# Patient Record
Sex: Male | Born: 1950
Health system: Southern US, Community
[De-identification: ages and names within clinical notes are randomized; demographics above are authoritative.]

## PROBLEM LIST (undated history)

## (undated) DIAGNOSIS — R000 Tachycardia, unspecified: Secondary | ICD-10-CM

## (undated) DIAGNOSIS — D649 Anemia, unspecified: Secondary | ICD-10-CM

## (undated) DIAGNOSIS — N400 Enlarged prostate without lower urinary tract symptoms: Secondary | ICD-10-CM

## (undated) DIAGNOSIS — K219 Gastro-esophageal reflux disease without esophagitis: Secondary | ICD-10-CM

## (undated) DIAGNOSIS — I82409 Acute embolism and thrombosis of unspecified deep veins of unspecified lower extremity: Secondary | ICD-10-CM

## (undated) DIAGNOSIS — E739 Lactose intolerance, unspecified: Secondary | ICD-10-CM

## (undated) DIAGNOSIS — I10 Essential (primary) hypertension: Secondary | ICD-10-CM

## (undated) DIAGNOSIS — C61 Malignant neoplasm of prostate: Secondary | ICD-10-CM

## (undated) DIAGNOSIS — M109 Gout, unspecified: Secondary | ICD-10-CM

## (undated) DIAGNOSIS — G629 Polyneuropathy, unspecified: Secondary | ICD-10-CM

## (undated) DIAGNOSIS — G4733 Obstructive sleep apnea (adult) (pediatric): Secondary | ICD-10-CM

## (undated) DIAGNOSIS — K469 Unspecified abdominal hernia without obstruction or gangrene: Secondary | ICD-10-CM

## (undated) DIAGNOSIS — K279 Peptic ulcer, site unspecified, unspecified as acute or chronic, without hemorrhage or perforation: Secondary | ICD-10-CM

## (undated) DIAGNOSIS — H409 Unspecified glaucoma: Secondary | ICD-10-CM

## (undated) DIAGNOSIS — K911 Postgastric surgery syndromes: Secondary | ICD-10-CM

## (undated) DIAGNOSIS — K900 Celiac disease: Secondary | ICD-10-CM

## (undated) DIAGNOSIS — R001 Bradycardia, unspecified: Secondary | ICD-10-CM

## (undated) DIAGNOSIS — J329 Chronic sinusitis, unspecified: Secondary | ICD-10-CM

## (undated) DIAGNOSIS — M1711 Unilateral primary osteoarthritis, right knee: Secondary | ICD-10-CM

## (undated) DIAGNOSIS — E785 Hyperlipidemia, unspecified: Secondary | ICD-10-CM

## (undated) DIAGNOSIS — F329 Major depressive disorder, single episode, unspecified: Secondary | ICD-10-CM

## (undated) DIAGNOSIS — E7800 Pure hypercholesterolemia, unspecified: Secondary | ICD-10-CM

## (undated) HISTORY — PX: INGUINAL HERNIA REPAIR: SUR1180

## (undated) HISTORY — DX: Celiac disease: K90.0

## (undated) HISTORY — DX: Peptic ulcer, site unspecified, unspecified as acute or chronic, without hemorrhage or perforation: K27.9

## (undated) HISTORY — DX: Polyneuropathy, unspecified: G62.9

## (undated) HISTORY — DX: Anemia, unspecified: D64.9

## (undated) HISTORY — DX: Postgastric surgery syndromes: K91.1

## (undated) HISTORY — DX: Lactose intolerance, unspecified: E73.9

## (undated) HISTORY — DX: Unilateral primary osteoarthritis, right knee: M17.11

## (undated) HISTORY — DX: Unspecified abdominal hernia without obstruction or gangrene: K46.9

## (undated) HISTORY — DX: Acute embolism and thrombosis of unspecified deep veins of unspecified lower extremity: I82.409

## (undated) HISTORY — DX: Hyperlipidemia, unspecified: E78.5

## (undated) HISTORY — DX: Obstructive sleep apnea (adult) (pediatric): G47.33

## (undated) HISTORY — DX: Major depressive disorder, single episode, unspecified: F32.9

## (undated) HISTORY — DX: Benign prostatic hyperplasia without lower urinary tract symptoms: N40.0

## (undated) HISTORY — PX: SHOULDER SURGERY: SHX246

## (undated) HISTORY — DX: Pure hypercholesterolemia, unspecified: E78.00

## (undated) HISTORY — PX: MASTECTOMY: SHX3

## (undated) HISTORY — DX: Unspecified glaucoma: H40.9

## (undated) HISTORY — DX: Gastro-esophageal reflux disease without esophagitis: K21.9

## (undated) HISTORY — DX: Essential (primary) hypertension: I10

## (undated) HISTORY — DX: Chronic sinusitis, unspecified: J32.9

## (undated) HISTORY — DX: Bradycardia, unspecified: R00.1

## (undated) HISTORY — DX: Malignant neoplasm of prostate: C61

## (undated) HISTORY — DX: Tachycardia, unspecified: R00.0

## (undated) HISTORY — PX: HERNIA REPAIR: SHX51

## (undated) HISTORY — DX: Gout, unspecified: M10.9

## (undated) HISTORY — PX: OTHER SURGICAL HISTORY: SHX169

---

## 2006-02-24 HISTORY — PX: GASTRIC BYPASS: SHX52

## 2007-03-25 ENCOUNTER — Ambulatory Visit (HOSPITAL_COMMUNITY): Admission: RE | Admit: 2007-03-25 | Discharge: 2007-03-25 | Payer: Self-pay | Admitting: Surgery

## 2007-04-26 ENCOUNTER — Inpatient Hospital Stay (HOSPITAL_COMMUNITY): Admission: RE | Admit: 2007-04-26 | Discharge: 2007-04-28 | Payer: Self-pay | Admitting: Orthopedic Surgery

## 2010-02-24 HISTORY — PX: SMALL BOWEL REPAIR: SHX6447

## 2010-07-09 NOTE — Op Note (Signed)
[name], [name]                 ACCOUNT NO.:  [id number]   MEDICAL RECORD NO.:  [id number]          PATIENT TYPE:  INP   LOCATION:  5041                         FACILITY:  [facility]   PHYSICIAN:  [name], M.D.   DATE OF BIRTH:  September 01, 1950   DATE OF PROCEDURE:  04/26/2007  DATE OF DISCHARGE:                               OPERATIVE REPORT   PREOPERATIVE DIAGNOSIS:  End-stage degenerative joint disease right  shoulder.   POSTOPERATIVE DIAGNOSIS:  End-stage degenerative joint disease right  shoulder.   PROCEDURE:  Right hemiarthroplasty of shoulder.   SURGEON:  [name], M.D.   ASSISTANT:  [name], P.A.   ANESTHESIA:  General.   INDICATIONS FOR PROCEDURE:  The patient is a 60 year old male with a  long history of having had severe pain in the right shoulder with  activity, nighttime pain, really light activity pain, and limitation of  range of motion.  We talked about treatment options and also felt the  most appropriate course of action for him was going to be  hemiarthroplasty.  Preoperative x-rays showed that on the axillary view  really a midline drill would go out the back of the scapula so the  hemiarthroplasty was the appropriate course of action for him and he is  brought to the operating room for this procedure.   DESCRIPTION OF PROCEDURE:  The patient was brought to the operating room  and after adequate general anesthesia was obtained, the patient was  placed on the operating table.  The right shoulder was then prepped and  draped in the usual sterile fashion.  Following this a routine incision  was made from the clavicle down to the insertion of the deltoid,  subcutaneous tissue dissected down to the level of the deltopectoral  interval which was clearly identified and the cephalic vein was  retracted laterally with the deltoid.  Once this was completed,  attention was turned toward the deltopectoral interval which was held  with a retractor.  The  clavipectoral fascia was then divided and the  self retaining retractor was put in place.  The subscapular was then  retracted and turned into a dynamic unit and the anterior capsule was  retracted and the anterior capsulotomy was performed all the way around  from the entire shoulder capsule was released on the humeral side.  At  this point attention was turned toward the humerus where a drill bit was  entered and this was sequentially reamed to a level of 14, although it  was difficult to get a 14 down.  Once it was completed, attention was  turned toward body sizing and we got the 12 body in.  The 14 really just  would not go and we backed off to the 12 at that point.  At this point  the 12 body was placed.  I looked at the socket and the socket looked  good.  I sized the socket which sized really to a 52 even though the  head seemed more like a 56.  We did size the actual intra-articular  portion up  to 52.  A 52 +15 was chosen and put through a range of  motion.  It felt great.  Using a 52 +19 pulled too tight.  A 56 +15 was  ____________ did not give good _____________.  A 52 +15 was chosen and  the trial body was removed.  The body put in place.  The osteophytes  were taken off all the way around.  There were three trial reductions,  at this point ____________ fragmentations in the socket and none was  seen.  The granular implant was put in place with the 52 +15 head and  then put through a range of motion.  Excellent range of motion was  achieved with a #6, #2 Ethibond interrupted figure-of-eight stitches to  close the subscapularis and at this point did get about 25 degrees to 30  degrees of external rotation although rotation was full.  At this point  the subcutaneous was allowed to fall together with 2-0 Vicryl and then a  3-0  Monocryl subcuticular stitch was used.  Steri-Strips was applied.  Sterile compressive dressing was applied.  The patient was taken to the  recovery room  where she was noted to be in satisfactory condition.  Estimated blood loss for the procedure was 200 mL.      [name], M.D.  Electronically Signed     JLG/MEDQ  D:  04/26/2007  T:  04/26/2007  Job:  [id number]   cc:   [name], P.A.

## 2010-07-09 NOTE — Op Note (Signed)
[name], [name] NO.:  [id number]   MEDICAL RECORD NO.:  [id number]          PATIENT TYPE:  AMB   LOCATION:  DAY                          FACILITY:  [facility]   PHYSICIAN:  Ardeth Sportsman, MD     DATE OF BIRTH:  02/17/1951   DATE OF PROCEDURE:  03/25/2007  DATE OF DISCHARGE:                               OPERATIVE REPORT   PRIMARY CARE PHYSICIAN:  Rowe Robert, M.D., [place], [place].   CARDIOLOGIST:  Lyn Records, M.D. with [facility].   PREOPERATIVE DIAGNOSIS:  Large right inguinal hernia, possible small  left recurrent inguinal hernia.   POSTOPERATIVE DIAGNOSIS:  Large direct right inguinal hernia.  Small  indirect recurrent left inguinal hernia versus indirect left inguinal  hernia.   PROCEDURE:  Laparoscopic bilateral inguinal hernia repair.   ANESTHESIA:  General anesthesia.  Local anesthetic in a field block.  Bilateral ilioinguinal/genitofemoral/cord nerve blocks.   SPECIMENS:  None.   DRAINS:  None.   ESTIMATED BLOOD LOSS:  3 mL.   COMPLICATIONS:  No major complications.   INDICATIONS FOR PROCEDURE:  The patient is a 60 year old obese gentleman  with numerous health issues.  He has had an open inguinal hernia repair  in the past with some persistent left groin pain.  He has had some  increased right groin pain.  Also note orthopedic surgeon is Harvie Junior, M.D.  A left inguinal hernia was done open was complicated by  wound infection, but no recent skin infections.   Anatomy and physiology of abdominal formation and embryology of  testicular migration was explained, pathophysiology of inguinal  herniations with its risks of incarceration, strangulation, and other  risks were discussed.  Options were discussed and recommended for  laparoscopic preperitoneal bilateral inguinal hernia exploration with  right and possible left inguinal hernia repairs.   Risks such as stroke, heart attack, deep venous thrombosis, pulmonary  embolism, and death were discussed.  He had already just completed a six-  month course of anticoagulation for pulmonary embolism and stable off  this and was cleared by cardiology.  Risks such as bleeding hematoma,  seroma, wound infection, abscess, need for transfusion, hernia  recurrence, prolonged pain, and numerous other risks of urinary  retention, testicular loss, and other risks were discussed.  Questions  were answered and he agreed to proceed.   FINDINGS:  He has a large indirect inguinal hernia that did not seem to  be incarcerated at this time with a chronic sac.  He had a small but  definite left inguinal hernia as well in the indirect with a moderate  size cord lipoma on the left side.   DESCRIPTION OF PROCEDURE:  Informed consent was confirmed.  It was not  known if the patient voided just prior to the operating room so after  induction of general anesthesia which he tolerated well, we sterilely  I&O catheterized him one time.  He was positioned supine with both arms  tucked.  He had some compressive devices activated during the entire  case.  His abdomen was clipped, prepped and  draped in the usual sterile  fashion.   Entry was gained in the abdominal wall through an infraumbilical  curvilinear incision.  Because he is morbidly obese with BMI of 36, I  had to make the infraumbilical incision a little larger to get down to  identify the anterior rectus fascia.  A nick was made in the anterior  rectus fascia and a 12 mm Hasson port was placed in the preperitoneal  plane posterior to the left rectus abdominal muscle.  Capnoperitoneum to  15 mmHg provided good abdominal wall insufflation.  Camera dissection  was used to help free the peritoneum off the anterior abdominal wall and  enough working space was created such that a 5 mm ports were placed in  the left and right mid abdomen.   Attention was turned to the right side.  He had dense adhesions to his  midline and  despite him having only upper midline incision these were  freed sharply down, but I was able to free the peritoneum down.  I could  see a large swath of peritoneum going up into the internal ring  consistent with a large indirect inguinal hernia.  The cord structures  were freed off the hernia sac and the hernia sac was reduced.  There was  no significant cord lipoma associated with it.  The peritoneum was  peeled off as proximally as possible.  A window was made from the  anterolateral bladder and pelvis on the right side near the level of the  obturator foramen.  During dissection freeing the peritoneum off the  right rectus, there was some bleeding that was controlled with pressure  and time.   Dissection was carried out in a mirror image fashion on the left side.  The peritoneum could be seen crawling up into the inguinal canal  consistent with a recurrent indirect inguinal hernia.  There was a  moderate size cord lipoma associated with this.  The hernia was not  massive, but definitely present.  The peritoneum was peeled off of this.  In doing some dissection of the cord lipoma, there was some bleeding and  pressure was held.  I hesitated to use cautery to avoid over  skeletonization and injury to the spermatic vessels.  Vas deferens was  seen and preserved on both sides.  The peritoneum was peeled off again  as proximally as possible and lateral.   Camera inspection revealed small tears in both hernia sacs on each side.  These were closed using 3-0 Vicryl interrupted stitches using  laparoscopic intracoreal suturing.   A 15 x 15 cm ultra light weight polypropylene mesh (Ultrapro) was used  for each side.  Mesh was cut to a half-skull shape and placed in a  mirror-image fashion.  Mesh was placed in a medial and inferior flap  rested in the true pelvis covering around the level of the obturator  foramen between the bladder and the pelvic side wall.  Mesh laid well  medially,  superiorly, laterally, and proximally such that there was at  least 3 inches circumferential coverage around both internal rings.  Any  possible direct femoral and obturator defects were covered as well.   The lead points of both hernia sacs were grasped and elevated cephalad  and slightly proximal as capnopreperitoneum was evacuated.  Let it be  known that hemostasis was excellent at the end of the case.  There was  some persistent pneumoperitoneum and this was freed by making an opening  in  the peritoneum just at the infraumbilical fascial opening carefully.  That helped completely evacuate the rest of the capnoperitoneum.  The  ports were removed.  Infraumbilical fascial defect was closed using 0  Vicryl stitch to good results.  Skin was closed using 4-0 Monocryl  stitch.  Sterile dressing was applied.  The patient was extubated and  taken to the recovery room in stable condition.   I explained the operative recovery recommendations to the patient just  prior to surgery and I discussed it again with his family just right  after surgery.  They expressed understanding and appreciation.      Ardeth Sportsman, MD  Electronically Signed     SCG/MEDQ  D:  03/25/2007  T:  03/25/2007  Job:  [id number]   cc:   Harvie Junior, M.D.  Fax: [phone]

## 2010-07-12 NOTE — Discharge Summary (Signed)
NAMEABIMELEC, GROCHOWSKI                 ACCOUNT NO.:  [id number]   MEDICAL RECORD NO.:  [id number]          PATIENT TYPE:  INP   LOCATION:  5041                         FACILITY:  [facility]   PHYSICIAN:  Harvie Junior, M.D.   DATE OF BIRTH:  1951-01-21   DATE OF ADMISSION:  04/26/2007  DATE OF DISCHARGE:  04/28/2007                               DISCHARGE SUMMARY   ADMITTING DIAGNOSES:  1. End-stage degenerative joint disease, glenohumeral joint right      shoulder.  2. History of pulmonary embolus.  3. Gouty arthritis.  4. Sleep apnea.  5. Hypertension.  6. Depression.   DISCHARGE DIAGNOSES:  1. End-stage degenerative joint disease, glenohumeral joint right      shoulder.  2. History of pulmonary embolus.  3. Gouty arthritis.  4. Sleep apnea.  5. Hypertension.  6. Depression.   PROCEDURES IN HOSPITAL:  Will be hemiarthroplasty, right shoulder, Jodi Geralds MD, April 26, 2007.   BRIEF HISTORY:  Mr. Mccallum is a pleasant 60 year old male who has a long  history of right shoulder pain.  He has had progressive pain and  stiffness in his shoulder and difficulties with activity of daily living  because of the limited motion of the pain.  He developed night pain and  pain with the use of his arm and quite a bit of popping and grinding in  the shoulder.  X-rays of the right shoulder showed that he had end-stage  glenohumeral joint degenerative joint disease, which was severe with  marked spurring.  Based on his radiographic and clinical findings, he is  felt to be a candidate for a right shoulder hemiarthroplasty.   PERTINENT LABORATORY STUDIES:  EKG on admission showed sinus  bradycardia, otherwise, normal EKG.  Hemoglobin on admission was 12.5,  hematocrit 37.5, and this is within normal limits.  On postop day #1,  his hemoglobin was 11.5 with a hematocrit of 34.2.  CMET on admission  showed no abnormalities.  Urinalysis was normal.   HOSPITAL COURSE:  Mr. Fuster underwent right shoulder  hemiarthroplasty.  Preoperatively, he was given Ancef and gentamicin IV.  He was put on PCA  morphine pump for pain control and occupational therapy saw the patient  for passive range of motion of the right shoulder.  On postop day #1,  he had a rough night, he was taking fluids and voiding without  difficulty.  His vital signs were stable.  He was afebrile.  His right  shoulder dressing was clean and dry.  His hemoglobin was 11.5.  He was  relatively continued on IV analgesics.  He was continued on PCA morphine  pump.  On postop day #2, he was doing relatively well.  He was ready to  be discharged.  He was taking fluids and voiding without difficulty,  ambulating in the hallway, and no chest pain or shortness of breath.  His vital signs were stable and he was afebrile.  His right shoulder was  wound benign.  His IV and PCA morphine pump was discontinued.  The  dressing was changed.  He was discharged  home in improved condition.  He  was given prescription for Percocet 5 mg p.r.n. for pain and Robaxin 750  mg p.r.n. spasm.  He was asked to take aspirin 325  mg b.i.d. with food as he had a previous history of pulmonary embolus.  He will need home health, occupational therapy 3 times per week for  passive range of motion of the right shoulder, avoiding external  rotation, follow up with Dr. Luiz Blare office in 10 days.      Marshia Ly, P.A.      Harvie Junior, M.D.  Electronically Signed    JB/MEDQ  D:  06/30/2007  T:  07/01/2007  Job:  [id number]

## 2010-11-14 LAB — BASIC METABOLIC PANEL
BUN: 8
GFR calc non Af Amer: >60
Potassium: 4.2
Sodium: 140

## 2010-11-14 LAB — PROTIME-INR: Prothrombin Time: 13.2

## 2010-11-15 LAB — CBC
MCV: 86.6
RBC: 4.32
WBC: 6.1

## 2010-11-15 LAB — COMPREHENSIVE METABOLIC PANEL
ALT: 17
Alkaline Phosphatase: 66
CO2: 29
Calcium: 9.2
Chloride: 102
GFR calc non Af Amer: >60
Glucose, Bld: 96
Potassium: 4.4
Sodium: 139
Total Bilirubin: 0.6

## 2010-11-15 LAB — URINALYSIS, ROUTINE W REFLEX MICROSCOPIC
Bilirubin Urine: NEGATIVE
Glucose, UA: NEGATIVE
Ketones, ur: NEGATIVE
pH: 6.5

## 2010-11-15 LAB — TYPE AND SCREEN: Antibody Screen: NEGATIVE

## 2010-11-18 LAB — CBC
HCT: 34.2 — ABNORMAL LOW
Hemoglobin: 11.5 — ABNORMAL LOW
MCHC: 33.6
RDW: 15.2

## 2014-05-08 DIAGNOSIS — E785 Hyperlipidemia, unspecified: Secondary | ICD-10-CM | POA: Diagnosis not present

## 2014-05-08 DIAGNOSIS — D649 Anemia, unspecified: Secondary | ICD-10-CM | POA: Diagnosis not present

## 2014-05-08 DIAGNOSIS — T7840XA Allergy, unspecified, initial encounter: Secondary | ICD-10-CM | POA: Diagnosis not present

## 2014-05-08 DIAGNOSIS — R42 Dizziness and giddiness: Secondary | ICD-10-CM | POA: Diagnosis not present

## 2014-05-08 DIAGNOSIS — N400 Enlarged prostate without lower urinary tract symptoms: Secondary | ICD-10-CM | POA: Diagnosis not present

## 2014-05-08 DIAGNOSIS — I10 Essential (primary) hypertension: Secondary | ICD-10-CM | POA: Diagnosis not present

## 2014-05-08 DIAGNOSIS — E739 Lactose intolerance, unspecified: Secondary | ICD-10-CM | POA: Diagnosis not present

## 2014-05-08 DIAGNOSIS — N429 Disorder of prostate, unspecified: Secondary | ICD-10-CM | POA: Diagnosis not present

## 2014-05-08 DIAGNOSIS — Z8042 Family history of malignant neoplasm of prostate: Secondary | ICD-10-CM | POA: Diagnosis not present

## 2014-05-15 DIAGNOSIS — E875 Hyperkalemia: Secondary | ICD-10-CM | POA: Diagnosis not present

## 2014-05-15 DIAGNOSIS — M109 Gout, unspecified: Secondary | ICD-10-CM | POA: Diagnosis not present

## 2014-05-16 DIAGNOSIS — R000 Tachycardia, unspecified: Secondary | ICD-10-CM | POA: Diagnosis not present

## 2014-05-16 DIAGNOSIS — I10 Essential (primary) hypertension: Secondary | ICD-10-CM | POA: Diagnosis not present

## 2014-05-16 DIAGNOSIS — E875 Hyperkalemia: Secondary | ICD-10-CM | POA: Diagnosis not present

## 2014-05-29 ENCOUNTER — Encounter: Payer: Self-pay | Admitting: Interventional Cardiology

## 2014-06-07 DIAGNOSIS — I951 Orthostatic hypotension: Secondary | ICD-10-CM | POA: Diagnosis not present

## 2014-06-07 DIAGNOSIS — E785 Hyperlipidemia, unspecified: Secondary | ICD-10-CM | POA: Diagnosis not present

## 2014-06-07 DIAGNOSIS — R000 Tachycardia, unspecified: Secondary | ICD-10-CM | POA: Diagnosis not present

## 2014-06-07 DIAGNOSIS — R269 Unspecified abnormalities of gait and mobility: Secondary | ICD-10-CM | POA: Diagnosis not present

## 2014-06-14 ENCOUNTER — Institutional Professional Consult (permissible substitution): Payer: Self-pay | Admitting: Interventional Cardiology

## 2014-06-14 ENCOUNTER — Encounter: Payer: Self-pay | Admitting: Neurology

## 2014-06-14 ENCOUNTER — Ambulatory Visit (INDEPENDENT_AMBULATORY_CARE_PROVIDER_SITE_OTHER): Payer: Medicare Other | Admitting: Neurology

## 2014-06-14 VITALS — BP 122/78 | HR 72 | Ht 70.5 in | Wt 257.0 lb

## 2014-06-14 DIAGNOSIS — R739 Hyperglycemia, unspecified: Secondary | ICD-10-CM

## 2014-06-14 DIAGNOSIS — G251 Drug-induced tremor: Secondary | ICD-10-CM | POA: Diagnosis not present

## 2014-06-14 DIAGNOSIS — G609 Hereditary and idiopathic neuropathy, unspecified: Secondary | ICD-10-CM | POA: Diagnosis not present

## 2014-06-14 DIAGNOSIS — R7309 Other abnormal glucose: Secondary | ICD-10-CM | POA: Diagnosis not present

## 2014-06-14 DIAGNOSIS — R251 Tremor, unspecified: Secondary | ICD-10-CM

## 2014-06-14 LAB — RPR

## 2014-06-14 LAB — FOLATE: Folate: 14.3 ng/mL

## 2014-06-14 LAB — HEMOGLOBIN A1C
Hgb A1c MFr Bld: 5.9 % — ABNORMAL HIGH (ref <5.7)
MEAN PLASMA GLUCOSE: 123 mg/dL — AB (ref <117)

## 2014-06-14 LAB — TSH: TSH: 0.89 u[IU]/mL (ref 0.350–4.500)

## 2014-06-14 LAB — VITAMIN B12: VITAMIN B 12: 609 pg/mL (ref 211–911)

## 2014-06-14 MED ORDER — GABAPENTIN 300 MG PO CAPS: ORAL_CAPSULE | ORAL | Status: DC

## 2014-06-14 NOTE — Progress Notes (Signed)
[name] was seen today in the movement disorders clinic for neurologic consultation at the request of DEWEY,ELIZABETH, MD.  The consultation is for the evaluation of tremor and gait changes.  The records that were made available to me were reviewed.  The patients wife supplements the history.  Pt thinks that tremor started before the balance issues.  It started perhaps a few years ago but it has been more noticeable since he moved back here over the last few months (prior to that he lived apart from his wife for 5 years and when he moved back his wife commented on it).  Over the last 2 weeks he was taken off of metoprolol and he is not sure if that affected tremor.  He is on a supplement called lithium orotate and has been on that for 6 months; he states that a Education officer, museum told him that it could ward off brain "fog."  He doesn't know if it changed tremor/balance.  He noted balance issues over the last few months, perhaps even longer.  He had a few falls but some were on ice when living in other areas.  He often feels that he is listing to the right.  He has paresthesias in the feet.  They feel cold "in my head but physically if I touch them they don't."  They also will burn in the middle of the night.  When he first gets out of bed and traps on the floor the plantar aspect will hurt significantly.  It seems to be better somewhat throughout the day although the feet do not necessarily ever feel normal.  He has an appointment with cardiology in the future as he thinks that the circulation is not good in the feet.    Tremor: Yes.     Affected by caffeine: doesn't know (drinks mellow yellow)  Affected by alcohol: doesn't drink alcohol  Affected by stress:  unknown  Affected by fatigue:  Unknown per pt; no per wife  Spills soup if on spoon:  No.  Spills glass of liquid if full:  No.  Affects ADL's (tying shoes, brushing teeth, etc):  No.  Other Specific Symptoms:  Voice: wife thinks mumbles  more and can't hear him as well Sleep: trouble staying asleep; wears CPAP and hasn't had new study since 1990's and mask is leaking - PCP is thinking about new study; if mask fitting right he sleeps through the night  Vivid Dreams:  No.  Acting out dreams:  No.  (no longer; used to sleep walk) Wet Pillows: No. Postural symptoms:  Yes.    Falls?  Yes.   but very rarely (fell down stairs in Jan but attributed to shoes/slippery stairs; also fell down an embankment) Bradykinesia symptoms: difficulty getting out of a chair Loss of smell:  No. Loss of taste:  No. Urinary Incontinence:  No. Difficulty Swallowing:  No. (sometimes will have cough after swallow pills) Handwriting, micrographia: No. Trouble with ADL's:  No.  Trouble buttoning clothing: No. Depression:  No. Memory changes:  No. Hallucinations:  No.  visual distortions: No. N/V:  No. Lightheaded:  No.  Syncope: No. Diplopia:  No. Dyskinesia:  No.  ALLERGIES:  No Known Allergies  CURRENT MEDICATIONS:  Outpatient Encounter Prescriptions as of 06/14/2014  Medication Sig  . allopurinol (ZYLOPRIM) 100 MG tablet Take 100 mg by mouth daily.  Marland Kitchen buPROPion (WELLBUTRIN XL) 150 MG 24 hr tablet Take 150 mg by mouth daily.  . citalopram (CELEXA) 40 MG  tablet Take 20 mg by mouth daily.  . finasteride (PROSCAR) 5 MG tablet Take 5 mg by mouth daily.  Marland Kitchen LITHIUM PO Take 5 mg by mouth daily. Lithium Orotate over the counter supplement  . pantoprazole (PROTONIX) 40 MG tablet Take 40 mg by mouth daily.  . simvastatin (ZOCOR) 40 MG tablet Take 40 mg by mouth daily.  . Travoprost, BAK Free, (TRAVATAN) 0.004 % SOLN ophthalmic solution 1 drop at bedtime.  . gabapentin (NEURONTIN) 300 MG capsule 1 tablet in the morning, 2 tablets at night    PAST MEDICAL HISTORY:   Past Medical History  Diagnosis Date  . Depression   . Gout   . Hypertension   . Hypercholesteremia   . BPH (benign prostatic hyperplasia)   . Tachycardia   . Glaucoma      PAST SURGICAL HISTORY:   Past Surgical History  Procedure Laterality Date  . Hernia repair      x 5  . Shoulder surgery Right   . Mastectomy Right   . Small bowel repair      for ulcer     SOCIAL HISTORY:   History   Social History  . Marital Status: Married    Spouse Name: N/A  . Number of Children: N/A  . Years of Education: N/A   Occupational History  . retired    Social History Main Topics  . Smoking status: Former Smoker    Quit date: 02/25/1980  . Smokeless tobacco: Not on file  . Alcohol Use: 0.0 oz/week    0 Standard drinks or equivalent per week     Comment: 1-2 times a year  . Drug Use: No  . Sexual Activity: Not on file   Other Topics Concern  . Not on file   Social History Narrative  . No narrative on file    FAMILY HISTORY:   Family Status  Relation Status Death Age  . Mother Deceased 48    dementia, alzheimer's, PD  . Father Deceased 51    prostate cancer, heart problems  . Sister Alive     healthy  . Brother Alive     healthy  . Daughter Alive     healthy    ROS:  A complete 10 system review of systems was obtained and was unremarkable apart from what is mentioned above.  PHYSICAL EXAMINATION:    VITALS:   Filed Vitals:   06/14/14 0749  BP: 122/78  Pulse: 72  Height: 5' 10.5" (1.791 m)  Weight: 257 lb (116.574 kg)    GEN:  The patient appears stated age and is in NAD. HEENT:  Normocephalic, atraumatic.  The mucous membranes are moist. The superficial temporal arteries are without ropiness or tenderness. CV:  RRR Lungs:  CTAB Neck/HEME:  There are no carotid bruits bilaterally.  Neurological examination:  Orientation: The patient is alert and oriented x3. Fund of knowledge is appropriate.  Recent and remote memory are intact.  Attention and concentration are normal.    Able to name objects and repeat phrases. Cranial nerves: There is good facial symmetry. Pupils are equal round and reactive to light bilaterally.  Fundoscopic exam reveals clear margins bilaterally. Extraocular muscles are intact. The visual fields are full to confrontational testing. The speech is fluent and clear. Soft palate rises symmetrically and there is no tongue deviation. Hearing is intact to conversational tone. Sensation: Sensation is intact to light and pinprick throughout (facial, trunk, extremities).  Pinprick is decreased in a stocking distribution.  Vibration is decreased in a distal fashion. There is no extinction with double simultaneous stimulation. There is no sensory dermatomal level identified. Motor: Strength is 5/5 in the bilateral upper and lower extremities.   Shoulder shrug is equal and symmetric.  There is no pronator drift. Deep tendon reflexes: Deep tendon reflexes are 1/4 at the bilateral biceps, triceps, brachioradialis, patella and absent at the bilateral achilles. Plantar responses are downgoing bilaterally.  Movement examination: Tone: There is normal tone in the bilateral upper extremities.  The tone in the lower extremities is normal.  Abnormal movements: Very minor evidence of tremor of the outstretched hands.  No evidence of asterixis.  The patient is able to draw archimedes spirals without significant difficulty.  The patient is able to pour water from one glass to another without spilling it.  Coordination:  There is no decremation with RAM's, with any form of rapid alternating movements, including alternating supination and pronation of the forearm, hand opening and closing, finger taps, heel taps and toe taps. Gait and Station: The patient has no difficulty arising out of a deep-seated chair without the use of the hands. The patient's stride length is normal, with normal arm swing and a slightly wide-based gait.  He has significant difficulty ambulating in a tandem fashion.  He is able to heel toe walk.  He is able to stand in the Romberg position with eyes open, but does sway with eyes closed.    Labs: I  reviewed labs from his primary care physician.  Patient states that these were fasting labs on 05/15/2014.  His fasting glucose was just slightly elevated at 101.  His AST was 15, ALT 14.  His potassium was just slightly elevated at 5.2 and the patient states that his medications were therefore adjusted.  ASSESSMENT/PLAN:  1.  Tremor.  -I saw very little evidence of tremor today.  I saw no evidence of any type of neurodegenerative tremor.  I saw no evidence of Parkinson's disease.  He is on a supplement called lithium orotate, and I explained to him that while lithium orotate is capable of providing lithium to the body, there are no systematic reviews supporting the efficacy and it is not approved by the FDA as it is a supplement.  However, I would suspect that like other forms of lithium, it could contribute to tremor.  I asked him to stop this and see how tremor responds. 2.  Gait instability  -The patient has strong evidence of peripheral neuropathy on his examination today.  I suspect that this is the root and etiology of his gait instability.  We had a long discussion about peripheral neuropathy.  Greater than 50% of the 60 minute visit was spent in counseling.  We discussed safety and pathophysiology.  We discussed etiologies as well.  -The patient wants to hold on EMG.  -We'll do lab work to look at reversible causes including B12, folate, RPR, SPEP/UPEP with immunofixation, hemoglobin A1c.  We will add a TSH given tremor.  -The patient would like to try gabapentin given the burning paresthesias.  He was given a titration schedule to ultimately work up to 300 mg in the morning and 600 mg at night.  He will let me know if he has any side effects.  Risks, benefits, side effects and alternative therapies were discussed.  The opportunity to ask questions was given and they were answered to the best of my ability.  The patient expressed understanding and willingness to follow the outlined  treatment  protocols. 3.  Obstructive sleep apnea syndrome.  -Patient and I talked about morbidity and mortality associated with untreated sleep apnea.  He is faithful with his CPAP but is having significant issues with leak.  It sounds like he may need either a new titration study or at least new gear/mask.  He is working with his primary care physician on this. 4.  I will see him back in the next few months, sooner should new neurologic issues arise.

## 2014-06-14 NOTE — Progress Notes (Signed)
Note routed to Dr Dewey.  

## 2014-06-14 NOTE — Patient Instructions (Signed)
1. Stop Lithium supplement.  2. Start Neurontin 300 mg. Take one tablet at night for one week. Then one tablet twice daily for one week. If tolerating medication you can increase to one tablet in the morning, two tablets at night.  3. Your provider has requested that you have labwork completed today. Please go to [facility] on the first floor of this building before leaving the office today.

## 2014-06-16 LAB — UIFE/LIGHT CHAINS/TP QN, 24-HR UR
Albumin, U: DETECTED
Alpha 1, Urine: DETECTED — AB
Alpha 2, Urine: DETECTED — AB
Beta, Urine: DETECTED — AB
GAMMA UR: DETECTED — AB
TOTAL PROTEIN, URINE-UPE24: 26 mg/dL — AB (ref 5–25)

## 2014-06-16 LAB — SPEP & IFE WITH QIG
ALBUMIN ELP: 3.7 g/dL — AB (ref 3.8–4.8)
ALPHA-1-GLOBULIN: 0.3 g/dL (ref 0.2–0.3)
Alpha-2-Globulin: 0.6 g/dL (ref 0.5–0.9)
Beta 2: 0.4 g/dL (ref 0.2–0.5)
Beta Globulin: 0.4 g/dL (ref 0.4–0.6)
Gamma Globulin: 0.8 g/dL (ref 0.8–1.7)
IGA: 208 mg/dL (ref 68–379)
IGM, SERUM: 63 mg/dL (ref 41–251)
IgG (Immunoglobin G), Serum: 816 mg/dL (ref 650–1600)
Total Protein, Serum Electrophoresis: 6.2 g/dL (ref 6.1–8.1)

## 2014-06-19 ENCOUNTER — Telehealth: Payer: Self-pay | Admitting: Neurology

## 2014-06-19 NOTE — Telephone Encounter (Signed)
Patient made aware labs okay. Results forwarded to Dr Ernie Hew.

## 2014-06-19 NOTE — Telephone Encounter (Signed)
-----   Message from [place], DO sent at 06/18/2014  9:17 PM EDT ----- You can let pt know that his labs looked ok.  Send copy to PCP

## 2014-06-23 DIAGNOSIS — N400 Enlarged prostate without lower urinary tract symptoms: Secondary | ICD-10-CM | POA: Diagnosis not present

## 2014-06-23 DIAGNOSIS — G64 Other disorders of peripheral nervous system: Secondary | ICD-10-CM | POA: Diagnosis not present

## 2014-06-23 DIAGNOSIS — I951 Orthostatic hypotension: Secondary | ICD-10-CM | POA: Diagnosis not present

## 2014-06-23 DIAGNOSIS — I10 Essential (primary) hypertension: Secondary | ICD-10-CM | POA: Diagnosis not present

## 2014-06-26 ENCOUNTER — Encounter: Payer: Self-pay | Admitting: Cardiology

## 2014-06-26 ENCOUNTER — Ambulatory Visit (INDEPENDENT_AMBULATORY_CARE_PROVIDER_SITE_OTHER): Payer: Medicare Other | Admitting: Cardiology

## 2014-06-26 VITALS — BP 130/70 | HR 54 | Ht 70.5 in | Wt 257.2 lb

## 2014-06-26 DIAGNOSIS — R5383 Other fatigue: Secondary | ICD-10-CM | POA: Insufficient documentation

## 2014-06-26 DIAGNOSIS — I10 Essential (primary) hypertension: Secondary | ICD-10-CM | POA: Diagnosis not present

## 2014-06-26 DIAGNOSIS — R0602 Shortness of breath: Secondary | ICD-10-CM | POA: Diagnosis not present

## 2014-06-26 DIAGNOSIS — T733XXA Exhaustion due to excessive exertion, initial encounter: Secondary | ICD-10-CM | POA: Diagnosis not present

## 2014-06-26 DIAGNOSIS — R002 Palpitations: Secondary | ICD-10-CM

## 2014-06-26 MED ORDER — METOPROLOL SUCCINATE ER 25 MG PO TB24: 12.5000 mg | ORAL_TABLET | Freq: Every day | ORAL | Status: DC

## 2014-06-26 NOTE — Progress Notes (Signed)
Cardiology Office Note   Date:  06/26/2014   ID:  [name], DOB Nov 25, 1950, MRN [id number]  PCP:  Rachell Cipro, MD    Chief Complaint  Patient presents with  . Palpitations  . Shortness of Breath  . Fatigue      History of Present Illness: [name] is a 64 y.o. male who presents for evaluation of LE pain.  He has a history of dyslipidemia, HTN, tachycardia and depression.  He was referred to neurology with complaints of coldness in his feet but when his wife would check them they were warm.  Then sometimes in the am with complaints of feeling like his feet were on fire.  The neurologist feels that he has a peripheral neuropathy and was placed on Neurontin.  He has some leg cramps in the bed but no pain with walking.  He also has a long history of tachycardia dating back to a surgery when his nurse noted post op that he was tachycardic and was evaluated by Cardiology in [place] but declined followup.  He did not have a workup done.  This was about 10 years.  He notices his pulse will be as high as 160bpm when taking his BP. He sometimes will feel his heart racing but most time he feels SOB with low energy and wiped out. He says that Dr. Ernie Hew has stopped one of his BP meds but he stopped the wrong one and instead stopped the metoprolol instead of the other med and then started having palpitations.  He finally realized it when he went back to see his PCP and started back on BB and palpitations resolved.  When he is taking the metoprolol it is very rare for him to take the palpitations.  He does not drink caffeinated products except for occasional mellow yellow.  He wore a heart monitor about 10 year ago but thinks it was normal.  He has been on BB since then.  He denies any chest pain or pressure but does have DOE when mowing the yard.  Last week he had to go in 3 times and rest but that was when he was off the BB and had the tachycardia.  After being back on the BB his SOB has resolved  but he does notice that he gets fatigued with exertion which is new for him.  He denies any LE edema, dizziness or syncope.     Past Medical History  Diagnosis Date  . Depression   . Gout   . Hypertension   . Hypercholesteremia   . BPH (benign prostatic hyperplasia)   . Tachycardia   . Glaucoma     Past Surgical History  Procedure Laterality Date  . Hernia repair      x 5  . Shoulder surgery Right   . Mastectomy Right   . Small bowel repair      for ulcer      Current Outpatient Prescriptions  Medication Sig Dispense Refill  . allopurinol (ZYLOPRIM) 100 MG tablet Take 100 mg by mouth 2 (two) times daily.     . citalopram (CELEXA) 40 MG tablet Take 20 mg by mouth daily.    . finasteride (PROSCAR) 5 MG tablet Take 5 mg by mouth daily.    Marland Kitchen gabapentin (NEURONTIN) 300 MG capsule 1 tablet in the morning, 2 tablets at night 90 capsule 3  . metoprolol succinate (TOPROL-XL) 25 MG 24 hr tablet Take 25 mg by mouth daily.    Marland Kitchen  pantoprazole (PROTONIX) 40 MG tablet Take 40 mg by mouth daily.    . simvastatin (ZOCOR) 40 MG tablet Take 40 mg by mouth daily.    . Travoprost, BAK Free, (TRAVATAN) 0.004 % SOLN ophthalmic solution 1 drop at bedtime.     No current facility-administered medications for this visit.    Allergies:   Review of patient's allergies indicates no known allergies.    Social History:  The patient  reports that he quit smoking about 34 years ago. He does not have any smokeless tobacco history on file. He reports that he drinks alcohol. He reports that he does not use illicit drugs.   Family History:  The patient's family history includes Alzheimer's disease in his mother; Arrhythmia in his father; Cancer in his father; Heart disease in his father.    ROS:  Please see the history of present illness.   Otherwise, review of systems are positive for none.   All other systems are reviewed and negative.    PHYSICAL EXAM: VS:  BP 130/70 mmHg  Pulse 54  Ht 5' 10.5"  (1.791 m)  Wt 257 lb 3.2 oz (116.665 kg)  BMI 36.37 kg/m2 , BMI Body mass index is 36.37 kg/(m^2). GEN: Well nourished, well developed, in no acute distress HEENT: normal Neck: no JVD, carotid bruits, or masses Cardiac: RRR; no murmurs, rubs, or gallops,no edema  Respiratory:  clear to auscultation bilaterally, normal work of breathing GI: soft, nontender, nondistended, + BS MS: no deformity or atrophy Skin: warm and dry, no rash Neuro:  Strength and sensation are intact Psych: euthymic mood, full affect   EKG:  EKG is ordered today. The ekg ordered today demonstrates sinus bradycardia at 54bpm with low voltage QRS in limb leads and  Septal infarct   Recent Labs: 06/14/2014: TSH 0.890    Lipid Panel No results found for: CHOL, TRIG, HDL, CHOLHDL, VLDL, LDLCALC, LDLDIRECT    Wt Readings from Last 3 Encounters:  06/26/14 257 lb 3.2 oz (116.665 kg)  06/14/14 257 lb (116.574 kg)       ASSESSMENT AND PLAN:  1.  Leg pain - symptoms c/w peripheral neuropathy.  He has no LE claudication symptoms.  He is followed by neuro and was placed on Neurotin 2.  Dyslipidemia - per PCP 3.  HTN - controlled on BB 4.  Tachcyardia - controlled on BB.  I will get a 2D echo to make sure his LVF is normal.  His HR is controlled on BB. 5.  DOE most likely secondary to BB withdrawal that has resolved after restarting BB.  6.  Fatigue - he is bradycardic today which may be the etiology but I would like to get a Stress myoview to rule out ischemia.  I have asked him to decrease his metoprolol to 25mg  1/2 tablet daily and I have asked him to call in a week and let me know if the fatigue improves.  If he has more palpitations on the lower dose of BB then will get a heart monitor.    Current medicines are reviewed at length with the patient today.  The patient does not have concerns regarding medicines.  The following changes have been made:  no change  Labs/ tests ordered today include: see above  assessment and plan No orders of the defined types were placed in this encounter.     Disposition:   FU with me PRN pending results of studies   Signed, Sueanne Margarita, MD  06/26/2014 4:20 PM  [facility] [address], [place], [place]  [address] Phone: [phone]; Fax: [phone]

## 2014-06-26 NOTE — Patient Instructions (Addendum)
Medication Instructions:  Your physician has recommended you make the following change in your medication: 1) DECREASE TOPROL to 25 mg 1/2 tablet (12.5 mg total) daily  Labwork: None  Testing/Procedures: Your physician has requested that you have an echocardiogram. Echocardiography is a painless test that uses sound waves to create images of your heart. It provides your doctor with information about the size and shape of your heart and how well your heart's chambers and valves are working. This procedure takes approximately one hour. There are no restrictions for this procedure.  Your physician has requested that you have a NUCLEAR STRESS TEST.  Follow-Up: You have an appointment with Dr. Radford Pax on August 04, 2014 at 1:45 PM.   Any Other Special Instructions Will Be Listed Below (If Applicable).

## 2014-07-05 ENCOUNTER — Telehealth: Payer: Self-pay | Admitting: Cardiology

## 2014-07-05 NOTE — Telephone Encounter (Signed)
Calling to follow up with Dr. Radford Pax. States he was told to call in reference to the decrease of Metoprolol to 12.5 mg. States  that he has not had any difficulty with cutting his Metoprolol to 12.5 mg.  States has not checked his HR since office visit on 5/2 but has not felt HR being fast.  Feels good. Advised will forward to Dr.Turner.

## 2014-07-05 NOTE — Telephone Encounter (Signed)
New message     Calling to give update on medication  Pt c/o medication issue:  1. Name of Medication: metoprolol  2. How are you currently taking this medication (dosage and times per day)? 25  1/2 tablet 3. Are you having a reaction (difficulty breathing--STAT)? no 4. What is your medication issue?  Seems to be working no tachycardia by cutting medication in half

## 2014-07-06 ENCOUNTER — Telehealth (HOSPITAL_COMMUNITY): Payer: Self-pay | Admitting: Radiology

## 2014-07-06 ENCOUNTER — Telehealth (HOSPITAL_COMMUNITY): Payer: Self-pay | Admitting: *Deleted

## 2014-07-06 NOTE — Telephone Encounter (Signed)
Patient given detailed instructions per Myocardial Perfusion Study Information Sheet for test on 07/10/14 at 8:30 Patient verbalized understanding. Crissie Figures, RN

## 2014-07-06 NOTE — Telephone Encounter (Signed)
.  Left message on voicemail in reference to upcoming appointment scheduled for 07/10/14. Phone number given for a call back so details instructions can be given. Crissie Figures, RN

## 2014-07-10 ENCOUNTER — Ambulatory Visit (HOSPITAL_BASED_OUTPATIENT_CLINIC_OR_DEPARTMENT_OTHER): Payer: Medicare Other

## 2014-07-10 ENCOUNTER — Other Ambulatory Visit: Payer: Self-pay

## 2014-07-10 ENCOUNTER — Ambulatory Visit (HOSPITAL_COMMUNITY): Payer: Medicare Other | Attending: Cardiovascular Disease

## 2014-07-10 DIAGNOSIS — R0600 Dyspnea, unspecified: Secondary | ICD-10-CM | POA: Insufficient documentation

## 2014-07-10 DIAGNOSIS — I10 Essential (primary) hypertension: Secondary | ICD-10-CM | POA: Insufficient documentation

## 2014-07-10 DIAGNOSIS — R0602 Shortness of breath: Secondary | ICD-10-CM

## 2014-07-10 DIAGNOSIS — E785 Hyperlipidemia, unspecified: Secondary | ICD-10-CM | POA: Insufficient documentation

## 2014-07-10 DIAGNOSIS — T733XXA Exhaustion due to excessive exertion, initial encounter: Secondary | ICD-10-CM

## 2014-07-10 LAB — MYOCARDIAL PERFUSION IMAGING
CHL CUP MPHR: 155 {beats}/min
CHL CUP NUCLEAR SRS: 4
CHL CUP NUCLEAR SSS: 5
CHL CUP STRESS STAGE 2 GRADE: 0 %
CHL CUP STRESS STAGE 2 SPEED: 0 mph
CHL CUP STRESS STAGE 3 HR: 53 {beats}/min
CHL CUP STRESS STAGE 3 SPEED: 0 mph
CHL CUP STRESS STAGE 4 GRADE: 10 %
CHL CUP STRESS STAGE 5 GRADE: 12 %
CHL CUP STRESS STAGE 5 HR: 108 {beats}/min
CHL CUP STRESS STAGE 6 GRADE: 14 %
CHL CUP STRESS STAGE 6 HR: 133 {beats}/min
CHL CUP STRESS STAGE 6 SPEED: 3.4 mph
CHL CUP STRESS STAGE 7 HR: 133 {beats}/min
CHL CUP STRESS STAGE 7 SPEED: 3.4 mph
CHL CUP STRESS STAGE 8 DBP: 85 mmHg
CHL CUP STRESS STAGE 8 SBP: 128 mmHg
CHL CUP STRESS STAGE 9 GRADE: 0 %
CHL CUP STRESS STAGE 9 SPEED: 0 mph
CSEPEDS: 0 s
Estimated workload: 10.1 METS
Exercise duration (min): 9 min
LV dias vol: 123 mL
LVSYSVOL: 45 mL
NUC STRESS EF: 63 %
Peak HR: 133 {beats}/min
Percent HR: 86 %
Percent of predicted max HR: 85 %
RATE: 0.4
Rest HR: 48 {beats}/min
SDS: 1
Stage 1 DBP: 94 mmHg
Stage 1 Grade: 0 %
Stage 1 HR: 52 {beats}/min
Stage 1 SBP: 146 mmHg
Stage 1 Speed: 0 mph
Stage 2 DBP: 93 mmHg
Stage 2 HR: 53 {beats}/min
Stage 2 SBP: 150 mmHg
Stage 3 Grade: 0.1 %
Stage 4 HR: 97 {beats}/min
Stage 4 Speed: 1.7 mph
Stage 5 DBP: 79 mmHg
Stage 5 SBP: 182 mmHg
Stage 5 Speed: 2.5 mph
Stage 6 DBP: 84 mmHg
Stage 6 SBP: 138 mmHg
Stage 7 Grade: 14 %
Stage 8 Grade: 0 %
Stage 8 HR: 110 {beats}/min
Stage 8 Speed: 0 mph
Stage 9 DBP: 82 mmHg
Stage 9 HR: 66 {beats}/min
Stage 9 SBP: 184 mmHg
TID: 0.87

## 2014-07-10 MED ORDER — TECHNETIUM TC 99M SESTAMIBI GENERIC - CARDIOLITE
11.0000 | Freq: Once | INTRAVENOUS | Status: AC | PRN
  Administered 2014-07-10: 11 via INTRAVENOUS

## 2014-07-10 MED ORDER — TECHNETIUM TC 99M SESTAMIBI GENERIC - CARDIOLITE
33.0000 | Freq: Once | INTRAVENOUS | Status: AC | PRN
  Administered 2014-07-10: 33 via INTRAVENOUS

## 2014-07-14 ENCOUNTER — Other Ambulatory Visit (HOSPITAL_COMMUNITY): Payer: Medicare Other

## 2014-07-18 DIAGNOSIS — H40033 Anatomical narrow angle, bilateral: Secondary | ICD-10-CM | POA: Diagnosis not present

## 2014-07-18 DIAGNOSIS — H4011X2 Primary open-angle glaucoma, moderate stage: Secondary | ICD-10-CM | POA: Diagnosis not present

## 2014-07-19 ENCOUNTER — Telehealth: Payer: Self-pay | Admitting: *Deleted

## 2014-07-19 NOTE — Telephone Encounter (Signed)
D/c it for a week and then, if doing better, we can try something else if he would like (could try lyrica samples)

## 2014-07-19 NOTE — Telephone Encounter (Signed)
Patient made aware. He will stop Gabapentin and we will check on him in a week.

## 2014-07-19 NOTE — Telephone Encounter (Signed)
Spoke with patient. He states he has been on 3 gabapentin a day for about 2 weeks and symptoms are worse. Prior to starting medication he was having some "hotness" in feet that would wake him up in the morning, along with numbness, and gait instability. He states he is still having all these symptoms but the feeling of heat in his feet is all day long and he has also developed stabbing/shooting pains in feet. He feels much worse. Please advise.

## 2014-07-19 NOTE — Telephone Encounter (Signed)
Patient call in reference to his tolerance to Gabapentin    Call back number [phone]

## 2014-07-26 ENCOUNTER — Telehealth: Payer: Self-pay | Admitting: Neurology

## 2014-07-26 DIAGNOSIS — I10 Essential (primary) hypertension: Secondary | ICD-10-CM | POA: Diagnosis not present

## 2014-07-26 DIAGNOSIS — N400 Enlarged prostate without lower urinary tract symptoms: Secondary | ICD-10-CM | POA: Diagnosis not present

## 2014-07-26 DIAGNOSIS — F329 Major depressive disorder, single episode, unspecified: Secondary | ICD-10-CM | POA: Diagnosis not present

## 2014-07-26 DIAGNOSIS — H40033 Anatomical narrow angle, bilateral: Secondary | ICD-10-CM | POA: Diagnosis not present

## 2014-07-26 DIAGNOSIS — G64 Other disorders of peripheral nervous system: Secondary | ICD-10-CM | POA: Diagnosis not present

## 2014-07-26 DIAGNOSIS — H4011X2 Primary open-angle glaucoma, moderate stage: Secondary | ICD-10-CM | POA: Diagnosis not present

## 2014-07-26 NOTE — Telephone Encounter (Signed)
Spoke with patient and he states he is doing better daily off the Neurontin. He saw his PCP this morning who is switching him from Citalopram to Cymbalta for his depression. He was told the Cymbalta could also help the neuropathy. He is going to try this first before starting any other medications. He will let us know how he does and call with any questions.

## 2014-07-26 NOTE — Telephone Encounter (Signed)
Left message on machine for patient to call back. To see how patient is doing off Neurontin and to see if he is interested in Lyrica. Awaiting call back.

## 2014-08-02 NOTE — Progress Notes (Signed)
Cardiology Office Note   Date:  08/03/2014   ID:  [name], DOB 03/03/1950, MRN [id number]  PCP:  Rachell Cipro, MD    Chief Complaint  Patient presents with  . Follow-up    essential hypertension, benign      History of Present Illness: [name] is a 64 y.o. male who presents for followup of LE pain. He has a history of dyslipidemia, HTN, tachycardia and depression. He was referred to neurology with complaints of coldness in his feet but when his wife would check them they were warm. Then sometimes in the am with complaints of feeling like his feet were on fire. The neurologist feels that he has a peripheral neuropathy and was placed on Neurontin. He has some leg cramps in the bed but no pain with walking. He also has a long history of tachycardia dating back to a surgery when his nurse noted post op that he was tachycardic and was evaluated by Cardiology in [place] but declined followup. He did not have a workup done. This was about 10 years. He notices his pulse will be as high as 160bpm when taking his BP. He sometimes will feel his heart racing but most time he feels SOB with low energy and wiped out. He says that Dr. Ernie Hew has stopped one of his BP meds but he stopped the wrong one and instead stopped the metoprolol instead of the other med and then started having palpitations. He finally realized it when he went back to see his PCP and started back on BB and palpitations resolved. When he is taking the metoprolol it is very rare for him to have  the palpitations. He does not drink caffeinated products except for occasional mellow yellow. He wore a heart monitor about 10 year ago but thinks it was normal. He has been on BB since then. He denies any chest pain or pressure but was having DOE when mowing the yard and his BB was stopped.  He started having palpitations and was placed back on the BB.  After being back on the BB his SOB has resolved but he does notice that  he gets fatigued with exertion which is new for him. He denies any LE edema, dizziness or syncope. He underwent nuclear stress test which showed no ischemia and echo showed normal LVF with mild MR and TR.  He is now back for follow-up.  His metoprolol was decreased due to fatigue and bradycardia.  He has not had any further tachycardia. He cannot tell much difference in his level of fatigue after decreasing the dose of BB.  He says that if going completely off of the BB his heart would start to race.       Past Medical History  Diagnosis Date  . Depression   . Gout   . Hypertension   . Hypercholesteremia   . BPH (benign prostatic hyperplasia)   . Tachycardia   . Glaucoma     Past Surgical History  Procedure Laterality Date  . Hernia repair      x 5  . Shoulder surgery Right   . Mastectomy Right   . Small bowel repair      for ulcer      Current Outpatient Prescriptions  Medication Sig Dispense Refill  . allopurinol (ZYLOPRIM) 100 MG tablet Take 100 mg by mouth 2 (two) times daily.     Marland Kitchen  finasteride (PROSCAR) 5 MG tablet Take 5 mg by mouth daily.    Marland Kitchen gabapentin (NEURONTIN) 300 MG capsule 1 tablet in the morning, 2 tablets at night 90 capsule 3  . latanoprost (XALATAN) 0.005 % ophthalmic solution Place 1 drop into the left eye at bedtime.  3  . metoprolol succinate (TOPROL-XL) 25 MG 24 hr tablet Take 0.5 tablets (12.5 mg total) by mouth daily.    . pantoprazole (PROTONIX) 40 MG tablet Take 40 mg by mouth daily.    . simvastatin (ZOCOR) 40 MG tablet Take 40 mg by mouth daily.     No current facility-administered medications for this visit.    Allergies:   Review of patient's allergies indicates no known allergies.    Social History:  The patient  reports that he quit smoking about 34 years ago. He does not have any smokeless tobacco history on file. He reports that he drinks alcohol. He reports that he does not use illicit drugs.   Family History:  The patient's family  history includes Alzheimer's disease in his mother; Arrhythmia in his father; Cancer in his father; Heart disease in his father.    ROS:  Please see the history of present illness.   Otherwise, review of systems are positive for none.   All other systems are reviewed and negative.    PHYSICAL EXAM: VS:  BP 108/66 mmHg  Pulse 54  Ht 5' 10.5" (1.791 m)  Wt 255 lb (115.667 kg)  BMI 36.06 kg/m2  SpO2 95% , BMI Body mass index is 36.06 kg/(m^2). GEN: Well nourished, well developed, in no acute distress HEENT: normal Neck: no JVD, carotid bruits, or masses Cardiac: RRR; no murmurs, rubs, or gallops,no edema  Respiratory:  clear to auscultation bilaterally, normal work of breathing GI: soft, nontender, nondistended, + BS MS: no deformity or atrophy Skin: warm and dry, no rash Neuro:  Strength and sensation are intact Psych: euthymic mood, full affect   EKG:  EKG is not ordered today.    Recent Labs: 06/14/2014: TSH 0.890    Lipid Panel No results found for: CHOL, TRIG, HDL, CHOLHDL, VLDL, LDLCALC, LDLDIRECT    Wt Readings from Last 3 Encounters:  08/03/14 255 lb (115.667 kg)  07/10/14 252 lb (114.306 kg)  06/26/14 257 lb 3.2 oz (116.665 kg)      ASSESSMENT AND PLAN:  1. Leg pain - symptoms c/w peripheral neuropathy. He has no LE claudication symptoms. He is followed by neuro and was placed on Neurotin 2. Dyslipidemia - per PCP 3. HTN - controlled on BB 4. Tachcyardia - controlled on BB. 2D echo showed normal LVF. His HR is controlled on BB but he is bradycardic and feeling fatigued.   5. DOE most likely secondary to BB withdrawal that has resolved after restarting BB.  6. Fatigue - most likely secondary to bradycardia.   Stress myoview showed no ischemia.  I will hange Toprol to Pindolol 2.5mg  BID in hopes to prevent palpitations while avoiding excess bradycardia.  He will call in 1 week to let me know if he is feeling better on the Pindolol.  He will come in for  EKG in 2 weeks with the nurse.  Current medicines are reviewed at length with the patient today.  The patient does not have concerns regarding medicines.  The following changes have been made:  D/c Toprol.  Start Pindolol 2.5mg  BID  Labs/ tests ordered today: See above Assessment and Plan No orders of the defined types were placed  in this encounter.     Disposition:   FU with me in 6 months  Signed, Sueanne Margarita, MD  08/03/2014 2:08 PM    [facility] [place], [place], [place]  [address] Phone: [phone]; Fax: [phone]

## 2014-08-03 ENCOUNTER — Ambulatory Visit (INDEPENDENT_AMBULATORY_CARE_PROVIDER_SITE_OTHER): Payer: Medicare Other | Admitting: Cardiology

## 2014-08-03 ENCOUNTER — Encounter: Payer: Self-pay | Admitting: Cardiology

## 2014-08-03 VITALS — BP 108/66 | HR 54 | Ht 70.5 in | Wt 255.0 lb

## 2014-08-03 DIAGNOSIS — I10 Essential (primary) hypertension: Secondary | ICD-10-CM

## 2014-08-03 DIAGNOSIS — R001 Bradycardia, unspecified: Secondary | ICD-10-CM | POA: Insufficient documentation

## 2014-08-03 DIAGNOSIS — R002 Palpitations: Secondary | ICD-10-CM

## 2014-08-03 DIAGNOSIS — T733XXA Exhaustion due to excessive exertion, initial encounter: Secondary | ICD-10-CM | POA: Diagnosis not present

## 2014-08-03 MED ORDER — PINDOLOL 5 MG PO TABS: 2.5000 mg | ORAL_TABLET | Freq: Two times a day (BID) | ORAL | Status: DC

## 2014-08-03 NOTE — Patient Instructions (Signed)
Medication Instructions:  Your physician has recommended you make the following change in your medication:  1) STOP METOPROLOL 2) START PINDOLOL 2.5 mg TWICE DAILY  Labwork: None  Testing/Procedures: None  Follow-Up: Dr. Radford Pax recommends you have a nurse visit for an EKG in 2 weeks.  Your physician wants you to follow-up in: 6 months with Dr. Radford Pax. You will receive a reminder letter in the mail two months in advance. If you don't receive a letter, please call our office to schedule the follow-up appointment.   Any Other Special Instructions Will Be Listed Below (If Applicable).

## 2014-08-04 ENCOUNTER — Ambulatory Visit: Payer: PRIVATE HEALTH INSURANCE | Admitting: Cardiology

## 2014-08-10 DIAGNOSIS — H40031 Anatomical narrow angle, right eye: Secondary | ICD-10-CM | POA: Diagnosis not present

## 2014-08-11 ENCOUNTER — Telehealth: Payer: Self-pay | Admitting: Cardiology

## 2014-08-11 NOTE — Telephone Encounter (Signed)
Patient wanted to relay he is tolerating his pindolol well and feeling great. Confirmed EKG visit with patient 6/22.

## 2014-08-11 NOTE — Telephone Encounter (Signed)
New Message  Pt called to give information about the medication that he was prescribed. Pt was unaware of the name of the medication. Was unable to retrieve more detail to relay to the nurse. Pt req a call back to from the nurse to go into detail.

## 2014-08-16 ENCOUNTER — Ambulatory Visit (INDEPENDENT_AMBULATORY_CARE_PROVIDER_SITE_OTHER): Payer: Medicare Other

## 2014-08-16 VITALS — BP 122/82 | HR 50 | Ht 71.0 in | Wt 251.0 lb

## 2014-08-16 DIAGNOSIS — R001 Bradycardia, unspecified: Secondary | ICD-10-CM | POA: Diagnosis not present

## 2014-08-16 DIAGNOSIS — R002 Palpitations: Secondary | ICD-10-CM

## 2014-08-16 NOTE — Progress Notes (Signed)
1.) Reason for visit: 2 week FU after stopping Toprol and starting Pindolol for bradycardia accompanied by fatigue  2.) Name of MD requesting visit: Dr. Radford Pax.  3.) H&P: Prior to last OV with Dr. Radford Pax, metoprolol was decreased due to fatigue and bradycardia. He has not had any further tachycardia. He couldn't tell much difference in his level of fatigue after decreasing the dose of BB. He says that if going completely off of the BB his heart would start to race. At the last OV, Toprol was stopped and Pindolol was started. Patient has no complaints at this time.  4.) Assessment and plan per MD: Per Dr. Irish Lack, [place], patient is to make no changes today. Will refer to Dr. Radford Pax for further recommendations.

## 2014-08-16 NOTE — Patient Instructions (Signed)
Medication Instructions:  Your physician recommends that you continue on your current medications as directed. Please refer to the Current Medication list given to you today.   Labwork: None  Testing/Procedures: None  Follow-Up: Your physician wants you to follow-up in: 6 months with Dr. Radford Pax. You will receive a reminder letter in the mail two months in advance. If you don't receive a letter, please call our office to schedule the follow-up appointment.   Any Other Special Instructions Will Be Listed Below (If Applicable). We will call you if Dr. Radford Pax has any further instructions.

## 2014-08-17 ENCOUNTER — Other Ambulatory Visit: Payer: Self-pay

## 2014-08-17 DIAGNOSIS — H4020X0 Unspecified primary angle-closure glaucoma, stage unspecified: Secondary | ICD-10-CM | POA: Diagnosis not present

## 2014-08-17 DIAGNOSIS — H40032 Anatomical narrow angle, left eye: Secondary | ICD-10-CM | POA: Diagnosis not present

## 2014-08-17 MED ORDER — PINDOLOL 5 MG PO TABS: 2.5000 mg | ORAL_TABLET | Freq: Two times a day (BID) | ORAL | Status: DC

## 2014-08-29 DIAGNOSIS — Z23 Encounter for immunization: Secondary | ICD-10-CM | POA: Diagnosis not present

## 2014-08-29 DIAGNOSIS — M109 Gout, unspecified: Secondary | ICD-10-CM | POA: Diagnosis not present

## 2014-08-29 DIAGNOSIS — I10 Essential (primary) hypertension: Secondary | ICD-10-CM | POA: Diagnosis not present

## 2014-08-29 DIAGNOSIS — F329 Major depressive disorder, single episode, unspecified: Secondary | ICD-10-CM | POA: Diagnosis not present

## 2014-09-13 ENCOUNTER — Ambulatory Visit (INDEPENDENT_AMBULATORY_CARE_PROVIDER_SITE_OTHER): Payer: Medicare Other | Admitting: Neurology

## 2014-09-13 ENCOUNTER — Encounter: Payer: Self-pay | Admitting: Neurology

## 2014-09-13 VITALS — BP 140/84 | HR 72 | Ht 71.0 in | Wt 250.0 lb

## 2014-09-13 DIAGNOSIS — R251 Tremor, unspecified: Secondary | ICD-10-CM | POA: Diagnosis not present

## 2014-09-13 DIAGNOSIS — G609 Hereditary and idiopathic neuropathy, unspecified: Secondary | ICD-10-CM

## 2014-09-13 MED ORDER — PREGABALIN 75 MG PO CAPS: 75.0000 mg | ORAL_CAPSULE | Freq: Two times a day (BID) | ORAL | Status: DC

## 2014-09-13 NOTE — Telephone Encounter (Signed)
This encounter was created in error - please disregard.

## 2014-09-13 NOTE — Progress Notes (Signed)
[name] was seen today in the movement disorders clinic for neurologic consultation at the request of DEWEY,ELIZABETH, MD.  The consultation is for the evaluation of tremor and gait changes.  The records that were made available to me were reviewed.  The patients wife supplements the history.  Pt thinks that tremor started before the balance issues.  It started perhaps a few years ago but it has been more noticeable since he moved back here over the last few months (prior to that he lived apart from his wife for 5 years and when he moved back his wife commented on it).  Over the last 2 weeks he was taken off of metoprolol and he is not sure if that affected tremor.  He is on a supplement called lithium orotate and has been on that for 6 months; he states that a Education officer, museum told him that it could ward off brain "fog."  He doesn't know if it changed tremor/balance.  He noted balance issues over the last few months, perhaps even longer.  He had a few falls but some were on ice when living in other areas.  He often feels that he is listing to the right.  He has paresthesias in the feet.  They feel cold "in my head but physically if I touch them they don't."  They also will burn in the middle of the night.  When he first gets out of bed and traps on the floor the plantar aspect will hurt significantly.  It seems to be better somewhat throughout the day although the feet do not necessarily ever feel normal.  He has an appointment with cardiology in the future as he thinks that the circulation is not good in the feet.  09/13/14 update:  The patient is following up today.  Last visit, I had him discontinue his supplement that he was on cause lithium orotate, as I thought that was contributing to his feeling of tremor.  Today, the patient states that he doesn't notice tremor but states that he never did and his wife was the one who noted tremor; she has not mentioned tremor at all.  Last visit, I also felt  that the patient clinically had peripheral neuropathy.  He underwent labwork for reversible causes and that was all unremarkable.  His hemoglobin A1c was 5.9.  B12 was 609.  There was no monoclonal protein in the serum or urinary protein electrophoresis.  RPR was negative.  He was started on gabapentin.  He did not even get up to the full dose and called me and stated that he thought it caused stabbing pains in the feet and he wanted to d/c it.  I told him to discontinue the medication for a week.  We called him back and he stated that his feet felt better, but that his primary care physician had changed his antidepressant from Celexa to Cymbalta on June 1.  Therefore, he was going to see if that helped the peripheral neuropathy symptoms as well.  Today, he is c/o stabbing pain in the feet.  He doesn't think that the cymbalta makes the neuropathy worse but he does have some stabbing pain in the big toe.  It comes and goes.  "I have a fair amount of numbness in the feet and they don't feel like they belong to me."  He notices near falls.  ALLERGIES:  No Known Allergies  CURRENT MEDICATIONS:  Outpatient Encounter Prescriptions as of 09/13/2014  Medication Sig  .  allopurinol (ZYLOPRIM) 100 MG tablet Take 100 mg by mouth 2 (two) times daily.   . DULoxetine (CYMBALTA) 60 MG capsule Take 60 mg by mouth daily.   . finasteride (PROSCAR) 5 MG tablet Take 5 mg by mouth daily.  . pantoprazole (PROTONIX) 40 MG tablet Take 40 mg by mouth daily.  . pindolol (VISKEN) 5 MG tablet Take 0.5 tablets (2.5 mg total) by mouth 2 (two) times daily.  . simvastatin (ZOCOR) 40 MG tablet Take 40 mg by mouth daily.  . timolol (TIMOPTIC) 0.5 % ophthalmic solution Place 1 drop into both eyes daily.   . pregabalin (LYRICA) 75 MG capsule Take 1 capsule (75 mg total) by mouth 2 (two) times daily.  . [DISCONTINUED] latanoprost (XALATAN) 0.005 % ophthalmic solution Place 1 drop into the left eye at bedtime.   No facility-administered  encounter medications on file as of 09/13/2014.    PAST MEDICAL HISTORY:   Past Medical History  Diagnosis Date  . Depression   . Gout   . Hypertension   . Hypercholesteremia   . BPH (benign prostatic hyperplasia)   . Tachycardia   . Glaucoma     PAST SURGICAL HISTORY:   Past Surgical History  Procedure Laterality Date  . Hernia repair      x 5  . Shoulder surgery Right   . Mastectomy Right   . Small bowel repair      for ulcer     SOCIAL HISTORY:   History   Social History  . Marital Status: Married    Spouse Name: N/A  . Number of Children: N/A  . Years of Education: N/A   Occupational History  . retired    Social History Main Topics  . Smoking status: Former Smoker    Quit date: 02/25/1980  . Smokeless tobacco: Not on file  . Alcohol Use: 0.0 oz/week    0 Standard drinks or equivalent per week     Comment: 1-2 times a year  . Drug Use: No  . Sexual Activity: Not on file   Other Topics Concern  . Not on file   Social History Narrative    FAMILY HISTORY:   Family Status  Relation Status Death Age  . Mother Deceased 65    dementia, alzheimer's, PD  . Father Deceased 71    prostate cancer, heart problems  . Sister Alive     healthy  . Brother Alive     healthy  . Daughter Alive     healthy    ROS:  A complete 10 system review of systems was obtained and was unremarkable apart from what is mentioned above.  PHYSICAL EXAMINATION:    VITALS:   Filed Vitals:   09/13/14 0803  BP: 140/84  Pulse: 72  Height: 5\' 11"  (1.803 m)  Weight: 250 lb (113.399 kg)    GEN:  The patient appears stated age and is in NAD. HEENT:  Normocephalic, atraumatic.  The mucous membranes are moist. The superficial temporal arteries are without ropiness or tenderness. CV:  RRR Lungs:  CTAB Neck/HEME:  There are no carotid bruits bilaterally.  Neurological examination:  Orientation: The patient is alert and oriented x3. Cranial nerves: There is good facial  symmetry. . Extraocular muscles are intact. The visual fields are full to confrontational testing. The speech is fluent and clear. Soft palate rises symmetrically and there is no tongue deviation. Hearing is intact to conversational tone. Sensation: Sensation is intact to light touch throughout.  Vibration is  decreased at the bilateral ankle. Motor: Strength is 5/5 in the bilateral upper and lower extremities.   Shoulder shrug is equal and symmetric.  There is no pronator drift.   Movement examination: Tone: There is normal tone in the bilateral upper extremities.  The tone in the lower extremities is normal.  Abnormal movements: no tremor, no asterixis Coordination:  There is no decremation with RAM's, with any form of rapid alternating movements, including alternating supination and pronation of the forearm, hand opening and closing, finger taps, heel taps and toe taps. Gait and Station: The patient has no difficulty arising out of a deep-seated chair without the use of the hands.    ASSESSMENT/PLAN:  1.  Tremor.  -no tremor noted.  Off of supplement lithium orotate. 2.  Gait instability  -The patient has strong evidence of peripheral neuropathy on his examination today.  I suspect that this is the root and etiology of his gait instability.  I talked to him today again about the fact that meds for PN don't help balance but may only help perception of paresthesias.  He is on cymbalta for depression and we talked about asking PCP if we should try increasing the dose.  In the end, he decided to try lyrica instead.  Gave him samples of lyrica 75 mg bid.  Will call him in 2 weeks and see how he is doing and may need to increase dose.  Risks, benefits, side effects and alternative therapies were discussed.  The opportunity to ask questions was given and they were answered to the best of my ability.  The patient expressed understanding and willingness to follow the outlined treatment protocols.  -The  patient wants to hold on EMG.  -He doesn't wish to do PT for balance.  He will let me know if he changes his mind. 3.  Obstructive sleep apnea syndrome.  -Patient and I talked about morbidity and mortality associated with untreated sleep apnea.  He is faithful with his CPAP but is having significant issues with leak.  It sounds like he may need either a new titration study or at least new gear/mask.  He is working with his primary care physician on this. 4.  I will see him back in the next few months, sooner should new neurologic issues arise.  Much greater than 50% of this visit was spent in counseling with the patient.  Total face to face time:  25 min

## 2014-10-02 ENCOUNTER — Telehealth: Payer: Self-pay | Admitting: Neurology

## 2014-10-02 NOTE — Telephone Encounter (Signed)
Spoke with patient to see how he is doing on Lyrica. He states he stopped it after two days. He states this magnified the symptoms, just like the gabapentin. He is not interested in physical therapy. He will keep follow up appt in October.

## 2014-10-02 NOTE — Telephone Encounter (Signed)
Does he wish to try anything else or just leave it alone for now?

## 2014-10-02 NOTE — Telephone Encounter (Signed)
He wanted to leave it alone for now.

## 2014-12-01 DIAGNOSIS — J309 Allergic rhinitis, unspecified: Secondary | ICD-10-CM | POA: Diagnosis not present

## 2014-12-01 DIAGNOSIS — Z23 Encounter for immunization: Secondary | ICD-10-CM | POA: Diagnosis not present

## 2014-12-01 DIAGNOSIS — G4733 Obstructive sleep apnea (adult) (pediatric): Secondary | ICD-10-CM | POA: Diagnosis not present

## 2014-12-01 DIAGNOSIS — H401132 Primary open-angle glaucoma, bilateral, moderate stage: Secondary | ICD-10-CM | POA: Diagnosis not present

## 2014-12-01 DIAGNOSIS — I10 Essential (primary) hypertension: Secondary | ICD-10-CM | POA: Diagnosis not present

## 2014-12-06 ENCOUNTER — Ambulatory Visit (INDEPENDENT_AMBULATORY_CARE_PROVIDER_SITE_OTHER): Payer: Medicare Other | Admitting: Neurology

## 2014-12-06 ENCOUNTER — Encounter: Payer: Self-pay | Admitting: Neurology

## 2014-12-06 VITALS — BP 118/78 | HR 62 | Resp 18 | Ht 71.0 in | Wt 243.0 lb

## 2014-12-06 DIAGNOSIS — R609 Edema, unspecified: Secondary | ICD-10-CM | POA: Diagnosis not present

## 2014-12-06 DIAGNOSIS — R351 Nocturia: Secondary | ICD-10-CM

## 2014-12-06 DIAGNOSIS — E669 Obesity, unspecified: Secondary | ICD-10-CM | POA: Diagnosis not present

## 2014-12-06 DIAGNOSIS — G609 Hereditary and idiopathic neuropathy, unspecified: Secondary | ICD-10-CM

## 2014-12-06 DIAGNOSIS — G4719 Other hypersomnia: Secondary | ICD-10-CM | POA: Diagnosis not present

## 2014-12-06 DIAGNOSIS — G4733 Obstructive sleep apnea (adult) (pediatric): Secondary | ICD-10-CM

## 2014-12-06 NOTE — Patient Instructions (Signed)
Based on your symptoms and your exam I believe you still have obstructive sleep apnea or OSA, and I think we should proceed with a sleep study to see how severe it is. If you have more than mild OSA, I want you to consider ongoing treatment with CPAP. Please remember, the risks and ramifications of moderate to severe obstructive sleep apnea or OSA are: Cardiovascular disease, including congestive heart failure, stroke, difficult to control hypertension, arrhythmias, and even type 2 diabetes has been linked to untreated OSA. Sleep apnea causes disruption of sleep and sleep deprivation in most cases, which, in turn, can cause recurrent headaches, problems with memory, mood, concentration, focus, and vigilance. Most people with untreated sleep apnea report excessive daytime sleepiness, which can affect their ability to drive. Please do not drive if you feel sleepy.   I will likely see you back after your sleep study to go over the test results and where to go from there. We will call you after your sleep study to advise about the results (most likely, you will hear from Beverlee Nims, my nurse) and to set up an appointment at the time, as necessary.    Our sleep lab administrative assistant, Arrie Aran will meet with you or call you to schedule your sleep study. If you don't hear back from her by next week please feel free to call her at [phone]. This is her direct line and please leave a message with your phone number to call back if you get the voicemail box. She will call back as soon as possible.

## 2014-12-06 NOTE — Progress Notes (Signed)
Subjective:    Patient ID: [name] is a 63 y.o. male.  HPI     [name], MD, [name] [facility] [address], [address] [place], [place] [address]  Dear Dr. Ernie Taylor,    I saw your patient , [name], upon your kind request in my neurologic clinic today for initial consultation of his sleep disorder, in particular reevaluation of his prior diagnosis of OSA. The patient is unaccompanied today. As you know, [name] is a 64 year old right-handed gentleman with an underlying medical history of allergic rhinitis, depression, glaucoma, gout, hypertension, hyperlipidemia, tremors, dizziness, and obesity, who was previously diagnosed with obstructive sleep apnea many years ago, almost 20 years ago, and he has been on CPAP for many years. He complains of sleep disruption, problems with mask leaking and dry mouth, and swallowing air. Prior sleep test results are not available for my review. Of note, he has lost weight since his original sleep apnea diagnosis, in the realm of 40 pounds since he first started treatment. I reviewed your office note from 12/01/2014, which you kindly included.  He has been purchasing his nasal pillows interface, L Swift FX and alternates with a L an Ultra Mirage II nasal mask. He does not always wake up rested. He has occasional nocturia but not nightly. He may have occasional restless leg symptoms and that he has an inner itching feeling in his feet. He was diagnosed with neuropathy. He has no history of diabetes. He drinks alcohol very rarely maybe once every few years. He used to drink beer regularly and in excess at times he admits. He quit drinking beer in 1992. He drinks caffeine in the form of coffee occasionally and occasional sodas but nothing daily. He quit smoking in 1982. He lives with his wife who has had some health issues of her own. He has one grown daughter and a 65-year-old grandchild. He is retired as of last year. He had 2  sleep studies in the past at [facility]. He is compliant with CPAP therapy. He has a second smaller CPAP unit for travel purposes. He does not use a humidifier. Epworth sleepiness score is 10 out of 24 today, his fatigue score is 45 out of 63.   His Past Medical History Is Significant For: Past Medical History  Diagnosis Date  . Depression   . Gout   . Hypertension   . Hypercholesteremia   . BPH (benign prostatic hyperplasia)   . Tachycardia   . Glaucoma   . Anemia, unspecified   . Major depressive disorder ([place])   . Gout   . Hyperlipemia   . Dizziness and giddiness   . Hyperkalemia   . Neuropathy ([place])   . Glaucoma     His Past Surgical History Is Significant For: Past Surgical History  Procedure Laterality Date  . Hernia repair      x 5  . Shoulder surgery Right   . Mastectomy Right   . Small bowel repair      for ulcer     His Family History Is Significant For: Family History  Problem Relation Age of Onset  . Alzheimer's disease Mother   . Dementia Mother   . Heart disease Father   . Cancer Father   . Arrhythmia Father     pacemaker    His Social History Is Significant For: Social History   Social History  . Marital Status: Married    Spouse Name: N/A  .  Number of Children: 1  . Years of Education: Masters   Occupational History  . retired    Social History Main Topics  . Smoking status: Former Smoker    Quit date: 02/25/1980  . Smokeless tobacco: Not on file  . Alcohol Use: No  . Drug Use: No  . Sexual Activity: Not on file   Other Topics Concern  . Not on file   Social History Narrative   Rarely drinks caffeine     His Allergies Are:  No Known Allergies:   His Current Medications Are:  Outpatient Encounter Prescriptions as of 12/06/2014  Medication Sig  . allopurinol (ZYLOPRIM) 100 MG tablet Take 100 mg by mouth 2 (two) times daily.   . DULoxetine (CYMBALTA) 60 MG capsule Take 60 mg by mouth daily.   . Ferrous Sulfate (IRON)  325 (65 FE) MG TABS Take by mouth.  . finasteride (PROSCAR) 5 MG tablet Take 5 mg by mouth daily.  Marland Kitchen loratadine (CLARITIN) 10 MG tablet Take 10 mg by mouth daily.  . pantoprazole (PROTONIX) 40 MG tablet Take 40 mg by mouth daily.  . pindolol (VISKEN) 5 MG tablet Take 0.5 tablets (2.5 mg total) by mouth 2 (two) times daily.  . simvastatin (ZOCOR) 40 MG tablet Take 40 mg by mouth daily.  . timolol (TIMOPTIC) 0.5 % ophthalmic solution Place 1 drop into both eyes daily.   . [DISCONTINUED] pregabalin (LYRICA) 75 MG capsule Take 1 capsule (75 mg total) by mouth 2 (two) times daily.   No facility-administered encounter medications on file as of 12/06/2014.  :  Review of Systems:  Out of a complete 14 point review of systems, all are reviewed and negative with the exception of these symptoms as listed below:   Review of Systems  Neurological:       Patient has been on CPAP for about 18 years. Last sleep study was done about 18 years ago at [facility]. He has not had a CPAP follow up since. He feels that his sleep has progressively become worse. Patient feels like air is coming out of mouth when using nasal mask and also feels that he swallows air during the night. Lost 40lbs since Jan 2016.  No trouble falling asleep, has trouble staying asleep, H/O snoring and apnea, decreased energy, feels fatigue in the morning, daytime tiredness, denies taking naps, morning headaches.     Objective:  Neurologic Exam  Physical Exam Physical Examination:   Filed Vitals:   12/06/14 1624  BP: 118/78  Pulse: 62  Resp: 18    General Examination: The patient is a very pleasant 64 y.o. male in no acute distress. He appears well-developed and well-nourished and well groomed.   HEENT: Normocephalic, atraumatic, pupils are equal, round and reactive to light and accommodation. Funduscopic exam is normal with sharp disc margins noted. Extraocular tracking is good without limitation to gaze excursion or  nystagmus noted. Normal smooth pursuit is noted. Hearing is grossly intact. Tympanic membranes are clear bilaterally. Face is symmetric with normal facial animation and normal facial sensation. Speech is clear with no dysarthria noted. There is no hypophonia. There is no lip, neck/head, jaw or voice tremor. Neck is supple with full range of passive and active motion. There are no carotid bruits on auscultation. Oropharynx exam reveals: mild mouth dryness, adequate dental hygiene, with several teeth missing. He says he has partials but does not always wear them. He has overall mild to moderate airway crowding secondary to redundant soft  palate and narrow airway entry. Tonsils are absent. Neck circumference is 16-3/4 inches. Mallampati is class II.  Chest: Clear to auscultation without wheezing, rhonchi or crackles noted.  Heart: S1+S2+0, regular and normal without murmurs, rubs or gallops noted.   Abdomen: Soft, non-tender and non-distended with normal bowel sounds appreciated on auscultation.  Extremities: There is trace pitting edema in the ankles bilaterally.   Skin: Warm and dry without trophic changes noted. There are no varicose veins.  Musculoskeletal: exam reveals no obvious joint deformities, tenderness or joint swelling or erythema.   Neurologically:  Mental status: The patient is awake, alert and oriented in all 4 spheres. His immediate and remote memory, attention, language skills and fund of knowledge are appropriate. There is no evidence of aphasia, agnosia, apraxia or anomia. Speech is clear with normal prosody and enunciation. Thought process is linear. Mood is normal and affect is normal.  Cranial nerves II - XII are as described above under HEENT exam. In addition: shoulder shrug is normal with equal shoulder height noted. Motor exam: Normal bulk, strength and tone is noted. There is no drift, tremor or rebound. Romberg is negative. Reflexes are 1+ in the upper extremities and trace  in the lower extremities. Fine motor skills are intact. Sensory exam is intact to all modalities in the upper extremities and decreased mildly in the distal lower extremities. He stands without problems. Posture is age-appropriate. Gait shows normal stride length and normal pace. No problems turning are noted. He turns en bloc.   Assessment and Plan:  In summary, [name] is a very pleasant 64 y.o.-year old male with an underlying medical history of allergic rhinitis, depression, glaucoma, gout, hypertension, hyperlipidemia, tremors, dizziness, and obesity, who was previously diagnosed with obstructive sleep apnea many years ago, almost 20 years ago, and he has been on CPAP for many years.  he has not had a reevaluation years. He has recently had weight loss in the realm of 40 pounds earlier this year. He feels that he swallows a lot of air and has difficulty tolerating it at this time. We were trying to get a CPAP download but were not successful. Prior sleep test results are not available for review. He says that he has gluten intolerance and has adhered to gluten free diet. He has had some stomach problems and is status post stomach surgery as well. His history and physical exam are in keeping with obstructive sleep apnea (OSA). I had a long chat with the patient about my findings and the diagnosis of OSA, its prognosis and treatment options. We talked about medical treatments, surgical interventions and non-pharmacological approaches. I explained in particular the risks and ramifications of untreated moderate to severe OSA, especially with respect to developing cardiovascular disease down the Road, including congestive heart failure, difficult to treat hypertension, cardiac arrhythmias, or stroke. Even type 2 diabetes has, in part, been linked to untreated OSA. Symptoms of untreated OSA include daytime sleepiness, memory problems, mood irritability and mood disorder such as depression and anxiety, lack  of energy, as well as recurrent headaches, especially morning headaches. We talked about trying to maintain a healthy lifestyle in general, as well as the importance of weight control. I encouraged the patient to eat healthy, exercise daily and keep well hydrated, to keep a scheduled bedtime and wake time routine, to not skip any meals and eat healthy snacks in between meals. I advised the patient not to drive when feeling sleepy. I recommended the following at this  time: sleep study with potential positive airway pressure titration. (We will score hypopneas at 4% and split the sleep study into diagnostic and treatment portion, if the estimated. 2 hour AHI is >15/h).   I explained the sleep test procedure to the patient and also outlined possible surgical and non-surgical treatment options of OSA, including the use of a custom-made dental device (which would require a referral to a specialist dentist or oral surgeon), upper airway surgical options, such as pillar implants, radiofrequency surgery, tongue base surgery, and UPPP (which would involve a referral to an ENT surgeon). Rarely, jaw surgery such as mandibular advancement may be considered.  I also explained the CPAP treatment option to the patient, who indicated that he would be willing to continue with CPAP. I explained the importance of being compliant with PAP treatment, not only for insurance purposes but primarily to improve His symptoms, and for the patient's long term health benefit, including to reduce His cardiovascular risks. I answered all his questions today and the patient was in agreement. I would like to see him back after the sleep study is completed and encouraged him to call with any interim questions, concerns, problems or updates.   Thank you very much for allowing me to participate in the care of this nice patient. If I can be of any further assistance to you please do not hesitate to call me at  [phone].  Sincerely,   [name], MD, [name]

## 2014-12-14 ENCOUNTER — Ambulatory Visit (INDEPENDENT_AMBULATORY_CARE_PROVIDER_SITE_OTHER): Payer: Medicare Other | Admitting: Neurology

## 2014-12-14 ENCOUNTER — Encounter: Payer: Self-pay | Admitting: Neurology

## 2014-12-14 VITALS — BP 124/76 | HR 53 | Ht 71.0 in | Wt 241.0 lb

## 2014-12-14 DIAGNOSIS — G609 Hereditary and idiopathic neuropathy, unspecified: Secondary | ICD-10-CM

## 2014-12-14 DIAGNOSIS — G4733 Obstructive sleep apnea (adult) (pediatric): Secondary | ICD-10-CM | POA: Diagnosis not present

## 2014-12-14 MED ORDER — TOPIRAMATE 100 MG PO TABS: 100.0000 mg | ORAL_TABLET | Freq: Every day | ORAL | Status: DC

## 2014-12-14 MED ORDER — TOPIRAMATE 25 MG PO TABS: ORAL_TABLET | ORAL | Status: DC

## 2014-12-14 NOTE — Progress Notes (Signed)
[name] was seen today in the movement disorders clinic for neurologic consultation at the request of DEWEY,ELIZABETH, MD.  The consultation is for the evaluation of tremor and gait changes.  The records that were made available to me were reviewed.  The patients wife supplements the history.  Pt thinks that tremor started before the balance issues.  It started perhaps a few years ago but it has been more noticeable since he moved back here over the last few months (prior to that he lived apart from his wife for 5 years and when he moved back his wife commented on it).  Over the last 2 weeks he was taken off of metoprolol and he is not sure if that affected tremor.  He is on a supplement called lithium orotate and has been on that for 6 months; he states that a Education officer, museum told him that it could ward off brain "fog."  He doesn't know if it changed tremor/balance.  He noted balance issues over the last few months, perhaps even longer.  He had a few falls but some were on ice when living in other areas.  He often feels that he is listing to the right.  He has paresthesias in the feet.  They feel cold "in my head but physically if I touch them they don't."  They also will burn in the middle of the night.  When he first gets out of bed and traps on the floor the plantar aspect will hurt significantly.  It seems to be better somewhat throughout the day although the feet do not necessarily ever feel normal.  He has an appointment with cardiology in the future as he thinks that the circulation is not good in the feet.  09/13/14 update:  The patient is following up today.  Last visit, I had him discontinue his supplement that he was on cause lithium orotate, as I thought that was contributing to his feeling of tremor.  Today, the patient states that he doesn't notice tremor but states that he never did and his wife was the one who noted tremor; she has not mentioned tremor at all.  Last visit, I also felt  that the patient clinically had peripheral neuropathy.  He underwent labwork for reversible causes and that was all unremarkable.  His hemoglobin A1c was 5.9.  B12 was 609.  There was no monoclonal protein in the serum or urinary protein electrophoresis.  RPR was negative.  He was started on gabapentin.  He did not even get up to the full dose and called me and stated that he thought it caused stabbing pains in the feet and he wanted to d/c it.  I told him to discontinue the medication for a week.  We called him back and he stated that his feet felt better, but that his primary care physician had changed his antidepressant from Celexa to Cymbalta on June 1.  Therefore, he was going to see if that helped the peripheral neuropathy symptoms as well.  Today, he is c/o stabbing pain in the feet.  He doesn't think that the cymbalta makes the neuropathy worse but he does have some stabbing pain in the big toe.  It comes and goes.  "I have a fair amount of numbness in the feet and they don't feel like they belong to me."  He notices near falls.  12/14/14 update:  The patient has a history of gait instability secondary to peripheral neuropathy.  I started  him on Lyrica, 75 mg twice a day last visit but the patient discontinued it after 1 pill stating that it increased his neuropathy symptoms.  He reported the same issue with gabapentin.  Feels that sx's are gradually getting worse; especially in the evening he will notice stabbing pain in the feet and itchy feet.  This can happen in the hands as well.  He is on Cymbalta by his primary care physician for depression.  He has refused balance therapy.  I did review records since last visit.  He did see Dr. Rexene Alberts on 12/06/2014 for his sleep apnea.  He is already on CPAP, but she did recommend a split-night sleep study.  PRIOR MEDS:  Lyrica (took 1 pill and stated made worse), neurontin (states made worse); on cymbalta for depression  ALLERGIES:  No Known  Allergies  CURRENT MEDICATIONS:  Outpatient Encounter Prescriptions as of 12/14/2014  Medication Sig  . allopurinol (ZYLOPRIM) 100 MG tablet Take 100 mg by mouth 2 (two) times daily.   . DULoxetine (CYMBALTA) 60 MG capsule Take 60 mg by mouth daily.   . Ferrous Sulfate (IRON) 325 (65 FE) MG TABS Take by mouth.  . finasteride (PROSCAR) 5 MG tablet Take 5 mg by mouth daily.  Marland Kitchen loratadine (CLARITIN) 10 MG tablet Take 10 mg by mouth daily.  . pantoprazole (PROTONIX) 40 MG tablet Take 40 mg by mouth daily.  . pindolol (VISKEN) 5 MG tablet Take 0.5 tablets (2.5 mg total) by mouth 2 (two) times daily.  . simvastatin (ZOCOR) 40 MG tablet Take 40 mg by mouth daily.  . timolol (TIMOPTIC) 0.5 % ophthalmic solution Place 1 drop into both eyes daily.    No facility-administered encounter medications on file as of 12/14/2014.    PAST MEDICAL HISTORY:   Past Medical History  Diagnosis Date  . Depression   . Gout   . Hypertension   . Hypercholesteremia   . BPH (benign prostatic hyperplasia)   . Tachycardia   . Glaucoma   . Anemia, unspecified   . Major depressive disorder ([place])   . Gout   . Hyperlipemia   . Dizziness and giddiness   . Hyperkalemia   . Neuropathy ([place])   . Glaucoma     PAST SURGICAL HISTORY:   Past Surgical History  Procedure Laterality Date  . Hernia repair      x 5  . Shoulder surgery Right   . Mastectomy Right   . Small bowel repair      for ulcer     SOCIAL HISTORY:   Social History   Social History  . Marital Status: Married    Spouse Name: N/A  . Number of Children: 1  . Years of Education: Masters   Occupational History  . retired    Social History Main Topics  . Smoking status: Former Smoker    Quit date: 02/25/1980  . Smokeless tobacco: Not on file  . Alcohol Use: No  . Drug Use: No  . Sexual Activity: Not on file   Other Topics Concern  . Not on file   Social History Narrative   Rarely drinks caffeine     FAMILY HISTORY:   Family  Status  Relation Status Death Age  . Mother Deceased 97    dementia, alzheimer's, PD  . Father Deceased 78    prostate cancer, heart problems  . Sister Alive     healthy  . Brother Alive     healthy  . Daughter Alive  healthy    ROS:  Has dropped weight due to gluten intolerance.  A complete 10 system review of systems was obtained and was unremarkable apart from what is mentioned above.  PHYSICAL EXAMINATION:    VITALS:   Filed Vitals:   12/14/14 0823  BP: 124/76  Pulse: 53  Height: 5\' 11"  (4.709 m)  Weight: 241 lb (109.317 kg)    GEN:  The patient appears stated age and is in NAD. HEENT:  Normocephalic, atraumatic.  The mucous membranes are moist. The superficial temporal arteries are without ropiness or tenderness. CV:  RRR Lungs:  CTAB Neck/HEME:  There are no carotid bruits bilaterally.  Neurological examination:  Orientation: The patient is alert and oriented x3. Cranial nerves: There is good facial symmetry. . Extraocular muscles are intact. The visual fields are full to confrontational testing. The speech is fluent and clear. Soft palate rises symmetrically and there is no tongue deviation. Hearing is intact to conversational tone. Sensation: Sensation is intact to light touch throughout.  Vibration is significantly decreased at the bilateral ankle. Motor: Strength is 5/5 in the bilateral upper and lower extremities.   Shoulder shrug is equal and symmetric.  There is no pronator drift.   Movement examination: Tone: There is normal tone in the bilateral upper extremities.  The tone in the lower extremities is normal.  Abnormal movements: no tremor, no asterixis Coordination:  There is no decremation with RAM's, with any form of rapid alternating movements, including alternating supination and pronation of the forearm, hand opening and closing, finger taps, heel taps and toe taps. Gait and Station: The patient has no difficulty arising out of a deep-seated chair  without the use of the hands.    ASSESSMENT/PLAN:  1.  Tremor.  -no tremor noted.  Off of supplement lithium orotate. 2.  Gait instability  -The patient has strong evidence of peripheral neuropathy on his examination today.   He has found neurontin and lyrica (although only tried 1 pill) intolerable.  I talked to him about increasing cymbalta, trying topamax, trying pamelor and discussed r/b/se of each.  Ultimately he wants to try topamax.  Risks, benefits, side effects, including but not limited to nephrolithiasis, acute angle closure glaucoma, word finding trouble.  The opportunity to ask questions was given and they were answered to the best of my ability.  The patient expressed understanding and willingness to follow the outlined treatment protocols.  -He doesn't wish to do PT for balance.  He will let me know if he changes his mind.  Discussed this today and discussed exercises that help with balance 3.  Obstructive sleep apnea syndrome.  -Seeing Dr. Rexene Alberts and to have a split night study.  Wearing CPAP currently 4.  I will see him back in the next few months, sooner should new neurologic issues arise.  Much greater than 50% of this visit was spent in counseling with the patient.  Total face to face time:  25 min

## 2014-12-14 NOTE — Patient Instructions (Signed)
Start topamax 25 mg - 1 tablet daily for 1 week, then 2 tablets daily for 1 week and then 3 tablets daily for 1 week and then start topamax 100mg  - 1 tablet daily

## 2014-12-22 DIAGNOSIS — S0501XA Injury of conjunctiva and corneal abrasion without foreign body, right eye, initial encounter: Secondary | ICD-10-CM | POA: Diagnosis not present

## 2014-12-25 DIAGNOSIS — S0501XD Injury of conjunctiva and corneal abrasion without foreign body, right eye, subsequent encounter: Secondary | ICD-10-CM | POA: Diagnosis not present

## 2014-12-26 ENCOUNTER — Ambulatory Visit (INDEPENDENT_AMBULATORY_CARE_PROVIDER_SITE_OTHER): Payer: Medicare Other | Admitting: Neurology

## 2014-12-26 DIAGNOSIS — G4733 Obstructive sleep apnea (adult) (pediatric): Secondary | ICD-10-CM

## 2014-12-26 DIAGNOSIS — G4720 Circadian rhythm sleep disorder, unspecified type: Secondary | ICD-10-CM

## 2014-12-26 DIAGNOSIS — G479 Sleep disorder, unspecified: Secondary | ICD-10-CM

## 2014-12-26 DIAGNOSIS — G4761 Periodic limb movement disorder: Secondary | ICD-10-CM

## 2014-12-27 NOTE — Sleep Study (Signed)
Please see the scanned sleep study interpretation located in the Procedure tab within the Chart Review section. 

## 2015-01-01 ENCOUNTER — Telehealth: Payer: Self-pay | Admitting: Neurology

## 2015-01-01 DIAGNOSIS — G4733 Obstructive sleep apnea (adult) (pediatric): Secondary | ICD-10-CM

## 2015-01-01 NOTE — Telephone Encounter (Signed)
Patient referred by Dr. Ernie Hew, seen by me on 12/06/14, diagnostic PSG on 12/26/14, ins: Medicare/[employer].   Please call and notify the patient that the recent sleep study did confirm the diagnosis of mild to moderate obstructive sleep apnea and that I recommend treatment for this in the form of CPAP. This will require a repeat sleep study for proper titration and mask fitting. Please explain to patient and arrange for a CPAP titration study. I have placed an order in the chart. Thanks, and please route to [facility] for scheduling next sleep study.  [name], MD, [name] [facility])

## 2015-01-01 NOTE — Telephone Encounter (Signed)
Pt states that the medication topiramate is working good and we will need to increase doseage since it is working please call pt at [phone]

## 2015-01-01 NOTE — Telephone Encounter (Signed)
Spoke with patient. He states the Topamax working well. He is about to graduate to 100 mg and was unclear if he was supposed to let us know if this prescription was sent to the pharmacy already or if he needed to let us know to send it. Aware already sent. He will check with the pharmacy on filling medication and call if needed.

## 2015-01-02 NOTE — Telephone Encounter (Signed)
Pt called for sleep results. He can be reached at [phone]

## 2015-01-03 NOTE — Telephone Encounter (Signed)
I spoke to patient and he is aware of results and recommendation. States that he has the titration study already scheduled.

## 2015-01-08 ENCOUNTER — Telehealth: Payer: Self-pay | Admitting: Neurology

## 2015-01-08 NOTE — Telephone Encounter (Signed)
VM-PT left message for a call back in regards to a medication but did not state what the medication was/Dawn CB# [phone]

## 2015-01-08 NOTE — Telephone Encounter (Signed)
Then he can d/c it.  My only other thought is to ask the dr who is prescribing the cymbalta if he could increase it.

## 2015-01-08 NOTE — Telephone Encounter (Signed)
Spoke with patient. He is now on Topamax 100 mg and states his balance is no better. He is still having some hurting and tingling in his feet. He states when he spoke to me on 01/01/2015 and stated "medication working well" what he meant was that he wasn't having any adverse side effects. He doesn't notice a difference between being on medication and being off of it. I offered referral to physical therapy, letting him know this would be the ultimate thing that would help balance issues. He declined referral again. Please advise.

## 2015-01-09 NOTE — Telephone Encounter (Signed)
Patient made aware. He will call back if he decides he would like referral to therapy.

## 2015-01-09 NOTE — Telephone Encounter (Signed)
Left message on machine for patient to call back.

## 2015-01-16 ENCOUNTER — Ambulatory Visit (INDEPENDENT_AMBULATORY_CARE_PROVIDER_SITE_OTHER): Payer: Medicare Other | Admitting: Neurology

## 2015-01-16 DIAGNOSIS — G4761 Periodic limb movement disorder: Secondary | ICD-10-CM

## 2015-01-16 DIAGNOSIS — G4720 Circadian rhythm sleep disorder, unspecified type: Secondary | ICD-10-CM

## 2015-01-16 DIAGNOSIS — G4733 Obstructive sleep apnea (adult) (pediatric): Secondary | ICD-10-CM

## 2015-01-16 DIAGNOSIS — G479 Sleep disorder, unspecified: Secondary | ICD-10-CM

## 2015-01-17 NOTE — Sleep Study (Signed)
Please see the scanned sleep study interpretation located in the Procedure tab within the Chart Review section. 

## 2015-01-22 ENCOUNTER — Telehealth: Payer: Self-pay | Admitting: Neurology

## 2015-01-22 DIAGNOSIS — G4733 Obstructive sleep apnea (adult) (pediatric): Secondary | ICD-10-CM

## 2015-01-22 NOTE — Telephone Encounter (Signed)
Patient referred by Dr. Ernie Hew, seen by me on 12/06/14, diagnostic PSG on 12/26/14, CPAP study on 01/16/15, ins: Medicare/[employer].  Please call and inform patient that I have entered an order for treatment with positive airway pressure (PAP) treatment of obstructive sleep apnea (OSA). He did well during the latest sleep study with CPAP. We will, therefore, arrange for a machine for home use through a DME (durable medical equipment) company of His choice; and I will see the patient back in follow-up in about 8-10 weeks. Please also explain to the patient that I will be looking out for compliance data, which can be downloaded from the machine (stored on an SD card, that is inserted in the machine) or via remote access through a modem, that is built into the machine. At the time of the followup appointment we will discuss sleep study results and how it is going with PAP treatment at home. Please advise patient to bring His machine at the time of the first FU visit, even though this is cumbersome. Bringing the machine for every visit after that will likely not be needed, but often helps for the first visit to troubleshoot if needed. Please re-enforce the importance of compliance with treatment and the need for [place] to monitor compliance data - often an insurance requirement and actually good feedback for the patient as far as how they are doing.  Also remind patient, that any interim PAP machine or mask issues should be first addressed with the DME company, as they can often help better with technical and mask fit issues. Please ask if patient has a preference regarding DME company.  Please also make sure, the patient has a follow-up appointment with me in about 8-10 weeks from the setup date, thanks.  Once you have spoken to the patient - and faxed/routed report to PCP and referring MD (if other than PCP), you can close this encounter, thanks,   [name], MD, [name] [facility] ([place])

## 2015-01-22 NOTE — Telephone Encounter (Signed)
I spoke to patient and he is willing to start treatment. I will send referral to [facility]. I will also fax report to PCP. Patient will call back to make f/u appt. I will send him a letter reminding him to make appt and stress the importance of compliance.

## 2015-03-01 DIAGNOSIS — I10 Essential (primary) hypertension: Secondary | ICD-10-CM | POA: Diagnosis not present

## 2015-03-01 DIAGNOSIS — M109 Gout, unspecified: Secondary | ICD-10-CM | POA: Diagnosis not present

## 2015-03-02 ENCOUNTER — Encounter: Payer: Self-pay | Admitting: Cardiology

## 2015-03-02 ENCOUNTER — Ambulatory Visit (INDEPENDENT_AMBULATORY_CARE_PROVIDER_SITE_OTHER): Payer: Medicare Other | Admitting: Cardiology

## 2015-03-02 VITALS — BP 124/82 | HR 56 | Ht 71.0 in | Wt 249.8 lb

## 2015-03-02 DIAGNOSIS — M109 Gout, unspecified: Secondary | ICD-10-CM | POA: Diagnosis not present

## 2015-03-02 DIAGNOSIS — R000 Tachycardia, unspecified: Secondary | ICD-10-CM | POA: Diagnosis not present

## 2015-03-02 DIAGNOSIS — D649 Anemia, unspecified: Secondary | ICD-10-CM | POA: Diagnosis not present

## 2015-03-02 DIAGNOSIS — R002 Palpitations: Secondary | ICD-10-CM | POA: Diagnosis not present

## 2015-03-02 DIAGNOSIS — I10 Essential (primary) hypertension: Secondary | ICD-10-CM

## 2015-03-02 DIAGNOSIS — E785 Hyperlipidemia, unspecified: Secondary | ICD-10-CM | POA: Diagnosis not present

## 2015-03-02 DIAGNOSIS — K219 Gastro-esophageal reflux disease without esophagitis: Secondary | ICD-10-CM | POA: Diagnosis not present

## 2015-03-02 MED ORDER — PINDOLOL 5 MG PO TABS: 2.5000 mg | ORAL_TABLET | Freq: Two times a day (BID) | ORAL | Status: DC

## 2015-03-02 NOTE — Progress Notes (Signed)
Cardiology Office Note   Date:  03/02/2015   ID:  [name], DOB 02/17/1951, MRN [id number]  PCP:  Rachell Cipro, MD    Chief Complaint  Patient presents with  . Palpitations  . Hypertension      History of Present Illness: [name] is a 65 y.o. male who presents for followup.  He has a history of LE pain with peripheral neuropathy.  He also has a long history of tachycardia dating back to a surgery when his nurse noted post op that he was tachycardic and was evaluated by Cardiology in [place] but declined followup. He underwent nuclear stress test which showed no ischemia and echo showed normal LVF with mild MR and TR. He is now back for follow-up. He denies any dizziness, chest pain, SOB, DOE or syncope.  He has chronic LE edema which is stable.  Since I saw him last he has had one episode of tachycardia after drinking too much caffeine and has cut back on caffeine with no further episodes.  He has had a few episodes of dizziness mainly with going from sitting to standing.    Past Medical History  Diagnosis Date  . Depression   . Gout   . Hypertension   . Hypercholesteremia   . BPH (benign prostatic hyperplasia)   . Tachycardia   . Glaucoma   . Anemia, unspecified   . Major depressive disorder ([place])   . Gout   . Hyperlipemia   . Dizziness and giddiness   . Hyperkalemia   . Neuropathy ([place])   . Glaucoma     Past Surgical History  Procedure Laterality Date  . Hernia repair      x 5  . Shoulder surgery Right   . Mastectomy Right   . Small bowel repair      for ulcer      Current Outpatient Prescriptions  Medication Sig Dispense Refill  . allopurinol (ZYLOPRIM) 100 MG tablet Take 100 mg by mouth 2 (two) times daily.     . DULoxetine (CYMBALTA) 60 MG capsule Take 60 mg by mouth daily.    . finasteride (PROSCAR) 5 MG tablet Take 5 mg by mouth daily.    Marland Kitchen loratadine (CLARITIN) 10 MG tablet Take 10 mg by mouth daily.    . pantoprazole  (PROTONIX) 40 MG tablet Take 40 mg by mouth daily.    . pindolol (VISKEN) 5 MG tablet Take 0.5 tablets (2.5 mg total) by mouth 2 (two) times daily. 90 tablet 3  . simvastatin (ZOCOR) 40 MG tablet Take 40 mg by mouth daily.    . timolol (TIMOPTIC) 0.5 % ophthalmic solution 1 drop 2 (two) times daily.     No current facility-administered medications for this visit.    Allergies:   Review of patient's allergies indicates no known allergies.    Social History:  The patient  reports that he quit smoking about 35 years ago. He does not have any smokeless tobacco history on file. He reports that he does not drink alcohol or use illicit drugs.   Family History:  The patient's family history includes Alzheimer's disease in his mother; Arrhythmia in his father; Cancer in his father; Dementia in his mother; Heart disease in his father.    ROS:  Please see the history of present illness.   Otherwise, review of systems are positive for none.   All other systems  are reviewed and negative.    PHYSICAL EXAM: VS:  BP 124/82 mmHg  Pulse 56  Ht 5\' 11"  (1.803 m)  Wt 249 lb 12.8 oz (113.309 kg)  BMI 34.86 kg/m2 , BMI Body mass index is 34.86 kg/(m^2). GEN: Well nourished, well developed, in no acute distress HEENT: normal Neck: no JVD, carotid bruits, or masses Cardiac: RRR; no murmurs, rubs, or gallops,no edema  Respiratory:  clear to auscultation bilaterally, normal work of breathing GI: soft, nontender, nondistended, + BS MS: no deformity or atrophy Skin: warm and dry, no rash Neuro:  Strength and sensation are intact Psych: euthymic mood, full affect   EKG:  EKG is not ordered today.    Recent Labs: 06/14/2014: TSH 0.890    Lipid Panel No results found for: CHOL, TRIG, HDL, CHOLHDL, VLDL, LDLCALC, LDLDIRECT    Wt Readings from Last 3 Encounters:  03/02/15 249 lb 12.8 oz (113.309 kg)  12/14/14 241 lb (109.317 kg)  12/06/14 243 lb (110.224 kg)    ASSESSMENT AND PLAN:  1. Leg pain  - symptoms c/w peripheral neuropathy. He has no LE claudication symptoms. He is followed by neuro and is on Neurotin 2. Dyslipidemia - per PCP 3. HTN - controlled on BB 4. Tachcyardia - controlled on BB. 2D echo showed normal LVF.   Current medicines are reviewed at length with the patient today.  The patient does not have concerns regarding medicines.  The following changes have been made:  no change  Labs/ tests ordered today: See above Assessment and Plan No orders of the defined types were placed in this encounter.     Disposition:   FU with me in 1 year  Signed, Sueanne Margarita, MD  03/02/2015 2:59 PM    [address], [place], [place]  [address] Phone: [phone]; Fax: [phone]

## 2015-03-02 NOTE — Patient Instructions (Signed)

## 2015-03-05 ENCOUNTER — Ambulatory Visit: Payer: Medicare Other | Admitting: Cardiology

## 2015-03-09 ENCOUNTER — Encounter: Payer: Self-pay | Admitting: Neurology

## 2015-03-09 ENCOUNTER — Ambulatory Visit (INDEPENDENT_AMBULATORY_CARE_PROVIDER_SITE_OTHER): Payer: Medicare Other | Admitting: Neurology

## 2015-03-09 VITALS — BP 111/70 | HR 54 | Resp 18 | Ht 71.0 in | Wt 250.0 lb

## 2015-03-09 DIAGNOSIS — G4733 Obstructive sleep apnea (adult) (pediatric): Secondary | ICD-10-CM | POA: Diagnosis not present

## 2015-03-09 DIAGNOSIS — E669 Obesity, unspecified: Secondary | ICD-10-CM

## 2015-03-09 DIAGNOSIS — Z9989 Dependence on other enabling machines and devices: Principal | ICD-10-CM

## 2015-03-09 NOTE — Patient Instructions (Addendum)

## 2015-03-09 NOTE — Progress Notes (Signed)
Subjective:    Patient ID: [name] is a 65 y.o. male.  HPI     Interim history:   [name] is a 65 year old right-handed gentleman with an underlying medical history of allergic rhinitis, depression, glaucoma, gout, hypertension, hyperlipidemia, tremors, dizziness, and obesity, who presents for follow-up consultation of his obstructive sleep apnea, after his recent sleep studies. The patient is unaccompanied today. I first met him on 12/06/2014 at the request of his primary care physician, at which time the patient reported a prior diagnosis of obstructive sleep apnea and he was placed on CPAP therapy in the past but had issues with sleep disruption, dry mouth, and difficulty tolerating CPAP. I invited him back for sleep study. He had a baseline sleep study, followed by a CPAP titration study. I went over his test results with him in detail today. His baseline sleep study from 12/26/2014 showed a sleep efficiency of 88.5% with a latency to sleep of 7.5 minutes and wake after sleep onset of 41 minutes with moderate sleep fragmentation noted. He had an elevated arousal index. He had an increased percentage of stage II sleep, absence of slow-wave sleep and a decreased percentage of REM sleep with a markedly prolonged REM latency. He had moderate PLMS with minimal arousals. He had no significant EKG or EEG changes. Mild to moderate snoring was noted. Total AHI was 8.5 per hour, rising to 31.1 per hour during REM sleep and 34.5 per hour in the supine position. Average oxygen saturation was 93%, nadir was 76% during REM sleep, time below 88% saturation was 7 minutes for the study.   Based on his sleep related complaints and his study results I invited him back for a full night CPAP titration study. He had this on 01/16/2015: Sleep efficiency was 80%, latency to sleep was 28 minutes, wake after sleep onset was 74 minutes with moderate sleep fragmentation noted. He had a high normal arousal index, normal  percentages of light stage sleep, absence of slow-wave sleep and an increased percentage of REM sleep at 41.5% with a mildly prolonged REM latency of 145 minutes. He had mild PLMS with minimal arousals. Average oxygen saturation was 94%, nadir was 86%. Time below 90% saturation was 4 minutes and 53 seconds. CPAP was titrated from 5 cm to 8 cm. AHI was 0 per hour on the final pressure with supine REM sleep achieved.  Based on his test results are prescribed CPAP therapy for home use.  Today, 03/09/2015: I reviewed his CPAP compliance data from 02/06/2015 through 03/07/2015 which is a total of 30 days during which time he used his machine every night with percent used days greater than 4 hours at 87%, indicating very good compliance with an average usage of 6 hours and 23 minutes, residual AHI low at 1.3 per hour, leak acceptable with the 95th percentile at 20.4 L/m on a pressure of 8 cm with EPR of 3.   Today, 03/09/2015: He reports doing okay. Compared to when he was first diagnosed with obstructive sleep apnea many years ago, he feels just okay. He remembers he felt a much more significant response to treatment when he was first diagnosed with obstructive sleep apnea in the past. He is trying to lose weight. He is compliant with treatment. He uses nasal pillows and likes the new nasal pillows but sometimes they dislodge and the head strap is not adjustable unfortunately. He's wondering whether he should go back to using a nose mask.  Previously:  12/06/2014: He was previously diagnosed with obstructive sleep apnea many years ago, almost 20 years ago, and he has been on CPAP for many years. He complains of sleep disruption, problems with mask leaking and dry mouth, and swallowing air. Prior sleep test results are not available for my review. Of note, he has lost weight since his original sleep apnea diagnosis, in the realm of 40 pounds since he first started treatment. I reviewed your office note from  12/01/2014, which you kindly included.  He has been purchasing his nasal pillows interface, L Swift FX and alternates with a L an Ultra Mirage II nasal mask. He does not always wake up rested. He has occasional nocturia but not nightly. He may have occasional restless leg symptoms and that he has an inner itching feeling in his feet. He was diagnosed with neuropathy. He has no history of diabetes. He drinks alcohol very rarely maybe once every few years. He used to drink beer regularly and in excess at times he admits. He quit drinking beer in 1992. He drinks caffeine in the form of coffee occasionally and occasional sodas but nothing daily. He quit smoking in 1982. He lives with his wife who has had some health issues of her own. He has one grown daughter and a 33-year-old grandchild. He is retired as of last year. He had 2 sleep studies in the past at [facility]. He is compliant with CPAP therapy. He has a second smaller CPAP unit for travel purposes. He does not use a humidifier. Epworth sleepiness score is 10 out of 24 today, his fatigue score is 45 out of 63.   His Past Medical History Is Significant For: Past Medical History  Diagnosis Date  . Depression   . Gout   . Hypertension   . Hypercholesteremia   . BPH (benign prostatic hyperplasia)   . Tachycardia   . Glaucoma   . Anemia, unspecified   . Major depressive disorder ([place])   . Gout   . Hyperlipemia   . Dizziness and giddiness   . Hyperkalemia   . Neuropathy ([place])   . Glaucoma     His Past Surgical History Is Significant For: Past Surgical History  Procedure Laterality Date  . Hernia repair      x 5  . Shoulder surgery Right   . Mastectomy Right   . Small bowel repair      for ulcer     His Family History Is Significant For: Family History  Problem Relation Age of Onset  . Alzheimer's disease Mother   . Dementia Mother   . Heart disease Father   . Cancer Father   . Arrhythmia Father     pacemaker    His  Social History Is Significant For: Social History   Social History  . Marital Status: Married    Spouse Name: N/A  . Number of Children: 1  . Years of Education: Masters   Occupational History  . retired    Social History Main Topics  . Smoking status: Former Smoker    Quit date: 02/25/1980  . Smokeless tobacco: None  . Alcohol Use: No  . Drug Use: No  . Sexual Activity: Not Asked   Other Topics Concern  . None   Social History Narrative   Rarely drinks caffeine     His Allergies Are:  No Known Allergies:   His Current Medications Are:  Outpatient Encounter Prescriptions as of 03/09/2015  Medication Sig  . allopurinol (  ZYLOPRIM) 100 MG tablet Take 100 mg by mouth 2 (two) times daily.   . DULoxetine (CYMBALTA) 60 MG capsule Take 60 mg by mouth daily.  . finasteride (PROSCAR) 5 MG tablet Take 5 mg by mouth daily.  Marland Kitchen loratadine (CLARITIN) 10 MG tablet Take 10 mg by mouth daily.  . pantoprazole (PROTONIX) 40 MG tablet Take 40 mg by mouth daily.  . pindolol (VISKEN) 5 MG tablet Take 0.5 tablets (2.5 mg total) by mouth 2 (two) times daily.  . simvastatin (ZOCOR) 40 MG tablet Take 40 mg by mouth daily.  . timolol (TIMOPTIC) 0.5 % ophthalmic solution 1 drop 2 (two) times daily.   No facility-administered encounter medications on file as of 03/09/2015.  :  Review of Systems:  Out of a complete 14 point review of systems, all are reviewed and negative with the exception of these symptoms as listed below:   Review of Systems  Neurological:       Patient feels like he is doing "ok" on CPAP. Having some trouble with the mask and feels like he is not sleeping well with CPAP. Reports having better results in the past.     Objective:  Neurologic Exam  Physical Exam Physical Examination:   Filed Vitals:   03/09/15 1034  BP: 111/70  Pulse: 54  Resp: 18   General Examination: The patient is a very pleasant 65 y.o. male in no acute distress. He appears well-developed and  well-nourished and well groomed.   HEENT: Normocephalic, atraumatic, pupils are equal, round and reactive to light and accommodation. Extraocular tracking is good without limitation to gaze excursion or nystagmus noted. Normal smooth pursuit is noted. Hearing is grossly intact. Face is symmetric with normal facial animation and normal facial sensation. Speech is clear with no dysarthria noted. There is no hypophonia. There is no lip, neck/head, jaw or voice tremor. Neck is supple with full range of passive and active motion. There are no carotid bruits on auscultation. Oropharynx exam reveals: mild to moderate mouth dryness, adequate dental hygiene, mild to moderate airway crowding secondary to redundant soft palate and narrow airway entry. Tonsils are absent. Mallampati is class II.  Chest: Clear to auscultation without wheezing, rhonchi or crackles noted.  Heart: S1+S2+0, regular and normal without murmurs, rubs or gallops noted.   Abdomen: Soft, non-tender and non-distended with normal bowel sounds appreciated on auscultation.  Extremities: There is trace pitting edema in the ankles bilaterally.   Skin: Warm and dry without trophic changes noted. There are no varicose veins.  Musculoskeletal: exam reveals no obvious joint deformities, tenderness or joint swelling or erythema.   Neurologically:  Mental status: The patient is awake, alert and oriented in all 4 spheres. His immediate and remote memory, attention, language skills and fund of knowledge are appropriate. There is no evidence of aphasia, agnosia, apraxia or anomia. Speech is clear with normal prosody and enunciation. Thought process is linear. Mood is normal and affect is normal.  Cranial nerves II - XII are as described above under HEENT exam. In addition: shoulder shrug is normal with equal shoulder height noted. Motor exam: Normal bulk, strength and tone is noted. There is no drift, tremor or rebound. Romberg is negative. Reflexes  are 1+ in the upper extremities and trace in the lower extremities. Fine motor skills are intact. Sensory exam is intact to all modalities in the upper extremities and decreased mildly in the distal lower extremities. He stands without problems. Posture is age-appropriate. Gait shows normal stride  length and normal pace. No problems turning are noted. He turns en bloc. Tandem walk is difficult for him.  Assessment and Plan:  In summary, CAMERON SCHWINN is a very pleasant 65 year old male with an underlying medical history of allergic rhinitis, depression, glaucoma, gout, hypertension, hyperlipidemia, tremors, dizziness, and obesity, who presents for follow-up consultation of his mild to severe obstructive sleep apnea, most pronounced during REM sleep. He had a baseline sleep study and a CPAP titration study, both in November 2016. We talked about his sleep test results in detail today. We also talked about his compliance data. He is commended for being compliant with treatment. While he does not have a telltale response to treatment he is motivated to continue with CPAP therapy. His original sleep apnea diagnosis was about 20 years ago. He was also most likely heavier at the time. He reports a 40 pound weight loss in the past year. He has had some stomach problems. He is going to follow-up with his GI doctor soon. He also has an appointment with his primary care physician. From my end of things, his exam is stable and he is doing fairly well with CPAP therapy, settings are adequate. We can certainly have him try a nose mask. We will check with his DME company if he is eligible for changing his mask. I will see him back in 6 months, sooner if needed. I answered all his questions today and the patient was in agreement. I spent 20 minutes in total face-to-face time with the patient, more than 50% of which was spent in counseling and coordination of care, reviewing test results, reviewing medication and discussing or  reviewing the diagnosis of OSA, its prognosis and treatment options.

## 2015-03-12 DIAGNOSIS — K219 Gastro-esophageal reflux disease without esophagitis: Secondary | ICD-10-CM | POA: Diagnosis not present

## 2015-03-15 ENCOUNTER — Telehealth: Payer: Self-pay | Admitting: Neurology

## 2015-03-15 NOTE — Telephone Encounter (Signed)
-----   Message from [place], DO sent at 03/15/2015  8:25 AM EST ----- Find out if pt still wants to come to appt tomorrow.  It was for f/u on topamax but he d/c it.  He is welcome to come but doesn't have to

## 2015-03-15 NOTE — Telephone Encounter (Signed)
Called patient and he agreed to cancel appt. He will call if needed.

## 2015-03-16 ENCOUNTER — Ambulatory Visit: Payer: Medicare Other | Admitting: Neurology

## 2015-03-28 ENCOUNTER — Telehealth: Payer: Self-pay | Admitting: Neurology

## 2015-03-28 DIAGNOSIS — R51 Headache: Secondary | ICD-10-CM | POA: Diagnosis not present

## 2015-03-28 DIAGNOSIS — I10 Essential (primary) hypertension: Secondary | ICD-10-CM | POA: Diagnosis not present

## 2015-03-28 DIAGNOSIS — J309 Allergic rhinitis, unspecified: Secondary | ICD-10-CM | POA: Diagnosis not present

## 2015-03-28 DIAGNOSIS — K279 Peptic ulcer, site unspecified, unspecified as acute or chronic, without hemorrhage or perforation: Secondary | ICD-10-CM | POA: Diagnosis not present

## 2015-03-28 NOTE — Telephone Encounter (Signed)
Pt called and would like to know if any one called his DME and talked to them about getting a nose mask. He has not heard anything. Please call and advise [phone]

## 2015-03-28 NOTE — Telephone Encounter (Signed)
I spoke to patient and I advised him that I will contact [facility] with [facility]. If he does not hear anything back by beginning of next week, to call us back.

## 2015-04-13 DIAGNOSIS — H401131 Primary open-angle glaucoma, bilateral, mild stage: Secondary | ICD-10-CM | POA: Diagnosis not present

## 2015-04-26 DIAGNOSIS — K219 Gastro-esophageal reflux disease without esophagitis: Secondary | ICD-10-CM | POA: Diagnosis not present

## 2015-04-26 DIAGNOSIS — E782 Mixed hyperlipidemia: Secondary | ICD-10-CM | POA: Diagnosis not present

## 2015-04-26 DIAGNOSIS — I10 Essential (primary) hypertension: Secondary | ICD-10-CM | POA: Diagnosis not present

## 2015-05-16 DIAGNOSIS — R51 Headache: Secondary | ICD-10-CM | POA: Diagnosis not present

## 2015-05-16 DIAGNOSIS — J309 Allergic rhinitis, unspecified: Secondary | ICD-10-CM | POA: Diagnosis not present

## 2015-05-16 DIAGNOSIS — K219 Gastro-esophageal reflux disease without esophagitis: Secondary | ICD-10-CM | POA: Diagnosis not present

## 2015-05-30 DIAGNOSIS — R000 Tachycardia, unspecified: Secondary | ICD-10-CM | POA: Diagnosis not present

## 2015-05-30 DIAGNOSIS — R51 Headache: Secondary | ICD-10-CM | POA: Diagnosis not present

## 2015-05-30 DIAGNOSIS — K219 Gastro-esophageal reflux disease without esophagitis: Secondary | ICD-10-CM | POA: Diagnosis not present

## 2015-06-29 DIAGNOSIS — E785 Hyperlipidemia, unspecified: Secondary | ICD-10-CM | POA: Diagnosis not present

## 2015-06-29 DIAGNOSIS — N400 Enlarged prostate without lower urinary tract symptoms: Secondary | ICD-10-CM | POA: Diagnosis not present

## 2015-06-29 DIAGNOSIS — Z0000 Encounter for general adult medical examination without abnormal findings: Secondary | ICD-10-CM | POA: Diagnosis not present

## 2015-06-29 DIAGNOSIS — D649 Anemia, unspecified: Secondary | ICD-10-CM | POA: Diagnosis not present

## 2015-06-29 DIAGNOSIS — I10 Essential (primary) hypertension: Secondary | ICD-10-CM | POA: Diagnosis not present

## 2015-07-02 DIAGNOSIS — Z0000 Encounter for general adult medical examination without abnormal findings: Secondary | ICD-10-CM | POA: Diagnosis not present

## 2015-07-02 DIAGNOSIS — R972 Elevated prostate specific antigen [PSA]: Secondary | ICD-10-CM | POA: Diagnosis not present

## 2015-07-02 DIAGNOSIS — I10 Essential (primary) hypertension: Secondary | ICD-10-CM | POA: Diagnosis not present

## 2015-07-02 DIAGNOSIS — M79606 Pain in leg, unspecified: Secondary | ICD-10-CM | POA: Diagnosis not present

## 2015-08-09 DIAGNOSIS — R140 Abdominal distension (gaseous): Secondary | ICD-10-CM | POA: Diagnosis not present

## 2015-08-09 DIAGNOSIS — Z980 Intestinal bypass and anastomosis status: Secondary | ICD-10-CM | POA: Diagnosis not present

## 2015-08-09 DIAGNOSIS — K900 Celiac disease: Secondary | ICD-10-CM | POA: Diagnosis not present

## 2015-08-09 DIAGNOSIS — E739 Lactose intolerance, unspecified: Secondary | ICD-10-CM | POA: Diagnosis not present

## 2015-08-13 DIAGNOSIS — G629 Polyneuropathy, unspecified: Secondary | ICD-10-CM | POA: Diagnosis not present

## 2015-08-13 DIAGNOSIS — H1031 Unspecified acute conjunctivitis, right eye: Secondary | ICD-10-CM | POA: Diagnosis not present

## 2015-08-13 DIAGNOSIS — R739 Hyperglycemia, unspecified: Secondary | ICD-10-CM | POA: Diagnosis not present

## 2015-08-15 DIAGNOSIS — D4959 Neoplasm of unspecified behavior of other genitourinary organ: Secondary | ICD-10-CM | POA: Diagnosis not present

## 2015-09-10 ENCOUNTER — Encounter: Payer: Self-pay | Admitting: Neurology

## 2015-09-10 ENCOUNTER — Ambulatory Visit (INDEPENDENT_AMBULATORY_CARE_PROVIDER_SITE_OTHER): Payer: Medicare Other | Admitting: Neurology

## 2015-09-10 VITALS — BP 122/79 | HR 116 | Resp 18 | Ht 71.0 in | Wt 262.0 lb

## 2015-09-10 DIAGNOSIS — E669 Obesity, unspecified: Secondary | ICD-10-CM

## 2015-09-10 DIAGNOSIS — G609 Hereditary and idiopathic neuropathy, unspecified: Secondary | ICD-10-CM

## 2015-09-10 DIAGNOSIS — G4733 Obstructive sleep apnea (adult) (pediatric): Secondary | ICD-10-CM | POA: Diagnosis not present

## 2015-09-10 DIAGNOSIS — Z9989 Dependence on other enabling machines and devices: Principal | ICD-10-CM

## 2015-09-10 NOTE — Progress Notes (Signed)
Subjective:    Patient ID: [name] is a 65 y.o. male.  HPI     Interim history:   [name] is a 65 year old right-handed gentleman with an underlying medical history of allergic rhinitis, depression, glaucoma, gout, hypertension, hyperlipidemia, tremors, dizziness, and obesity, who presents for follow-up consultation of his obstructive sleep apnea, after his recent sleep studies. The patient is unaccompanied today. I first met him on 12/06/2014 at the request of his primary care physician, at which time the patient reported a prior diagnosis of obstructive sleep apnea and he was placed on CPAP therapy in the past but had issues with sleep disruption, dry mouth, and difficulty tolerating CPAP. I invited him back for sleep study. He had a baseline sleep study, followed by a CPAP titration study. I went over his test results with him in detail today. His baseline sleep study from 12/26/2014 showed a sleep efficiency of 88.5% with a latency to sleep of 7.5 minutes and wake after sleep onset of 41 minutes with moderate sleep fragmentation noted. He had an elevated arousal index. He had an increased percentage of stage II sleep, absence of slow-wave sleep and a decreased percentage of REM sleep with a markedly prolonged REM latency. He had moderate PLMS with minimal arousals. He had no significant EKG or EEG changes. Mild to moderate snoring was noted. Total AHI was 8.5 per hour, rising to 31.1 per hour during REM sleep and 34.5 per hour in the supine position. Average oxygen saturation was 93%, nadir was 76% during REM sleep, time below 88% saturation was 7 minutes for the study.   Based on his sleep related complaints and his study results I invited him back for a full night CPAP titration study. He had this on 01/16/2015: Sleep efficiency was 80%, latency to sleep was 28 minutes, wake after sleep onset was 74 minutes with moderate sleep fragmentation noted. He had a high normal arousal index, normal  percentages of light stage sleep, absence of slow-wave sleep and an increased percentage of REM sleep at 41.5% with a mildly prolonged REM latency of 145 minutes. He had mild PLMS with minimal arousals. Average oxygen saturation was 94%, nadir was 86%. Time below 90% saturation was 4 minutes and 53 seconds. CPAP was titrated from 5 cm to 8 cm. AHI was 0 per hour on the final pressure with supine REM sleep achieved.  Based on his test results are prescribed CPAP therapy for home use.  Today, 03/09/2015: I reviewed his CPAP compliance data from 02/06/2015 through 03/07/2015 which is a total of 30 days during which time he used his machine every night with percent used days greater than 4 hours at 87%, indicating very good compliance with an average usage of 6 hours and 23 minutes, residual AHI low at 1.3 per hour, leak acceptable with the 95th percentile at 20.4 L/m on a pressure of 8 cm with EPR of 3.   Today, 03/09/2015: He reports doing well. He is compliant with CPAP. He occasionally sleeps on his stomach, sometimes he has dry mouth in the morning. He does not use the humidifier as he has become used to not using it and generally feels fine without it. He has gained a little bit of weight. He has had more issues with his neuropathy he feels. He has seen Dr. Carles Collet at [facility] neuro and was told to start physical therapy, which he has not yet pursued. He does not always drink enough water he admits.  Previously:   12/06/2014: He was previously diagnosed with obstructive sleep apnea many years ago, almost 20 years ago, and he has been on CPAP for many years. He complains of sleep disruption, problems with mask leaking and dry mouth, and swallowing air. Prior sleep test results are not available for my review. Of note, he has lost weight since his original sleep apnea diagnosis, in the realm of 40 pounds since he first started treatment. I reviewed your office note from 12/01/2014, which you kindly included.   He has been purchasing his nasal pillows interface, L Swift FX and alternates with a L an Ultra Mirage II nasal mask. He does not always wake up rested. He has occasional nocturia but not nightly. He may have occasional restless leg symptoms and that he has an inner itching feeling in his feet. He was diagnosed with neuropathy. He has no history of diabetes. He drinks alcohol very rarely maybe once every few years. He used to drink beer regularly and in excess at times he admits. He quit drinking beer in 1992. He drinks caffeine in the form of coffee occasionally and occasional sodas but nothing daily. He quit smoking in 1982. He lives with his wife who has had some health issues of her own. He has one grown daughter and a 66-year-old grandchild. He is retired as of last year. He had 2 sleep studies in the past at [facility]. He is compliant with CPAP therapy. He has a second smaller CPAP unit for travel purposes. He does not use a humidifier. Epworth sleepiness score is 10 out of 24 today, his fatigue score is 45 out of 63.   His Past Medical History Is Significant For: Past Medical History  Diagnosis Date  . Depression   . Gout   . Hypertension   . Hypercholesteremia   . BPH (benign prostatic hyperplasia)   . Tachycardia   . Glaucoma   . Anemia, unspecified   . Major depressive disorder ([place])   . Gout   . Hyperlipemia   . Dizziness and giddiness   . Hyperkalemia   . Neuropathy ([place])   . Glaucoma     His Past Surgical History Is Significant For: Past Surgical History  Procedure Laterality Date  . Hernia repair      x 5  . Shoulder surgery Right   . Mastectomy Right   . Small bowel repair      for ulcer     His Family History Is Significant For: Family History  Problem Relation Age of Onset  . Alzheimer's disease Mother   . Dementia Mother   . Heart disease Father   . Cancer Father   . Arrhythmia Father     pacemaker    His Social History Is Significant  For: Social History   Social History  . Marital Status: Married    Spouse Name: N/A  . Number of Children: 1  . Years of Education: Masters   Occupational History  . retired    Social History Main Topics  . Smoking status: Former Smoker    Quit date: 02/25/1980  . Smokeless tobacco: None  . Alcohol Use: No  . Drug Use: No  . Sexual Activity: Not Asked   Other Topics Concern  . None   Social History Narrative   Rarely drinks caffeine     His Allergies Are:  No Known Allergies:   His Current Medications Are:  Outpatient Encounter Prescriptions as of 09/10/2015  Medication Sig  .  allopurinol (ZYLOPRIM) 100 MG tablet Take 100 mg by mouth 2 (two) times daily.   . DULoxetine (CYMBALTA) 60 MG capsule Take 60 mg by mouth daily.  . finasteride (PROSCAR) 5 MG tablet Take 5 mg by mouth daily.  Marland Kitchen loratadine (CLARITIN) 10 MG tablet Take 10 mg by mouth daily.  . pantoprazole (PROTONIX) 40 MG tablet Take 40 mg by mouth daily.  . pindolol (VISKEN) 5 MG tablet Take 0.5 tablets (2.5 mg total) by mouth 2 (two) times daily.  . simvastatin (ZOCOR) 40 MG tablet Take 40 mg by mouth daily.  . timolol (TIMOPTIC) 0.5 % ophthalmic solution 1 drop 2 (two) times daily.   No facility-administered encounter medications on file as of 09/10/2015.  :  Review of Systems:  Out of a complete 14 point review of systems, all are reviewed and negative with the exception of these symptoms as listed below:   Review of Systems  Neurological:       Patient is here for CPAP f/u. No new concerns or problems.     Objective:  Neurologic Exam  Physical Exam Physical Examination:   Filed Vitals:   09/10/15 0810  BP: 122/79  Pulse: 116  Resp: 18   Pulse upon recheck was in the low 60s, regular.  General Examination: The patient is a very pleasant 65 y.o. male in no acute distress. He appears well-developed and well-nourished and well groomed.   HEENT: Normocephalic, atraumatic, pupils are equal,  round and reactive to light and accommodation. Extraocular tracking is good without limitation to gaze excursion or nystagmus noted. Normal smooth pursuit is noted. Hearing is grossly intact. Face is symmetric with normal facial animation and normal facial sensation. Speech is clear with no dysarthria noted. There is no hypophonia. There is no lip, neck/head, jaw or voice tremor. Neck is supple with full range of passive and active motion. There are no carotid bruits on auscultation. Oropharynx exam reveals: mild to moderate mouth dryness, adequate dental hygiene, mild to moderate airway crowding secondary to redundant soft palate and narrow airway entry. Tonsils are absent. Mallampati is class II.  Chest: Clear to auscultation without wheezing, rhonchi or crackles noted.  Heart: S1+S2+0, regular and normal without murmurs, rubs or gallops noted.   Abdomen: Soft, non-tender and non-distended with normal bowel sounds appreciated on auscultation.  Extremities: There is nonpitting puffiness bilaterally, right more than left. He recently scraped his right shin in his truck he reports. Mild residual bruising is noted.    Skin: Warm and dry without trophic changes noted. There are no varicose veins.  Musculoskeletal: exam reveals no obvious joint deformities, tenderness or joint swelling or erythema.   Neurologically:  Mental status: The patient is awake, alert and oriented in all 4 spheres. His immediate and remote memory, attention, language skills and fund of knowledge are appropriate. There is no evidence of aphasia, agnosia, apraxia or anomia. Speech is clear with normal prosody and enunciation. Thought process is linear. Mood is normal and affect is normal.  Cranial nerves II - XII are as described above under HEENT exam. In addition: shoulder shrug is normal with equal shoulder height noted. Motor exam: Normal bulk, strength and tone is noted. There is no drift, tremor or rebound. Romberg is  negative, except for mild swaying. Reflexes are 1+ in the upper extremities and trace in the lower extremities. Fine motor skills are intact. Sensory exam is intact to all modalities in the upper extremities and decreased mildly in the distal lower extremities.  He stands without problems. Posture is age-appropriate. Gait shows normal stride length and normal pace. No problems turning are noted. He turns en bloc. Tandem walk is difficult for him, unchanged.  Assessment and Plan:  In summary, [name] is a very pleasant 65 year old male with an underlying medical history of allergic rhinitis, depression, glaucoma, gout, hypertension, hyperlipidemia, tremors, dizziness, and obesity, who presents for follow-up consultation of his obstructive sleep apnea (most pronounced during REM sleep). He had a baseline sleep study and a CPAP titration study, both in November 2016. We talked about his sleep test results previously in detail and reviewed findings today. We also talked about his most recent compliance data. He is commended for being compliant with treatment.  he feels improved with respect to his sleep quality. Original sleep apnea diagnosis was about 20 years ago. He has gained some weight and we talked about the importance of weight management. He uses nasal pillows. I will prescribe a chinstrap as well and new supplies. From my end of things I suggested a one-year checkup. He is advised to call with any interim questions or concerns. I answered all his questions today and he was in agreement.  I spent 25 minutes in total face-to-face time with the patient, more than 50% of which was spent in counseling and coordination of care, reviewing test results, reviewing medication and discussing or reviewing the diagnosis of OSA, its prognosis and treatment options.

## 2015-09-10 NOTE — Patient Instructions (Signed)

## 2015-09-12 DIAGNOSIS — D4959 Neoplasm of unspecified behavior of other genitourinary organ: Secondary | ICD-10-CM | POA: Diagnosis not present

## 2015-09-19 DIAGNOSIS — R972 Elevated prostate specific antigen [PSA]: Secondary | ICD-10-CM | POA: Diagnosis not present

## 2015-09-27 DIAGNOSIS — H2513 Age-related nuclear cataract, bilateral: Secondary | ICD-10-CM | POA: Diagnosis not present

## 2015-09-27 DIAGNOSIS — H401131 Primary open-angle glaucoma, bilateral, mild stage: Secondary | ICD-10-CM | POA: Diagnosis not present

## 2015-09-27 DIAGNOSIS — H524 Presbyopia: Secondary | ICD-10-CM | POA: Diagnosis not present

## 2015-10-02 IMAGING — NM NM MISC PROCEDURE
7 series · 42 of 42 positions shown · non-contrast
Comparison: none

[Series 1: wbr_r-card_st rest · 6.4mm · 6.40mm/px · 6 of 21 frames shown]
[frame 2/21]
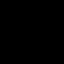
[frame 6/21]
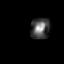
[frame 9/21]
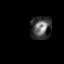
[frame 13/21]
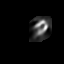
[frame 16/21]
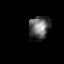
[frame 20/21]
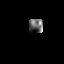

[Series 1: stress-gsp-mc · 6.40mm/px · 6 of 509 frames shown]
[frame 43/509  full-range]
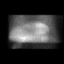
[frame 127/509  full-range]
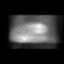
[frame 212/509  full-range]
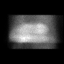
[frame 297/509  full-range]
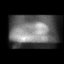
[frame 382/509  full-range]
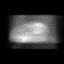
[frame 467/509  full-range]
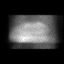

[Series 1: wbr_s-card_st stress-gsp · 6.4mm · 6.40mm/px · 6 of 153 frames shown]
[frame 13/153]
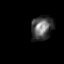
[frame 39/153]
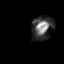
[frame 64/153]
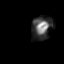
[frame 90/153]
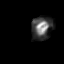
[frame 115/153]
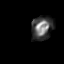
[frame 141/153]
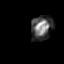

[Series 1: wbr_s-card_st stress-sum-em · 6.4mm · 6.40mm/px · 6 of 20 frames shown]
[frame 2/20]
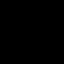
[frame 5/20]
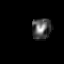
[frame 9/20]
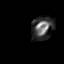
[frame 12/20]
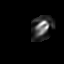
[frame 15/20]
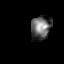
[frame 19/20]
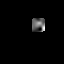

[Series 1: stress-sum-em · 6.40mm/px · 6 of 64 frames shown]
[frame 6/64]
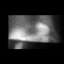
[frame 16/64]
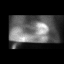
[frame 27/64]
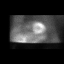
[frame 38/64]
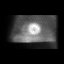
[frame 48/64]
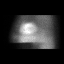
[frame 59/64]
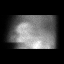

[Series 1: rest · 6.40mm/px · 6 of 64 frames shown]
[frame 6/64]
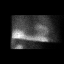
[frame 16/64]
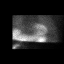
[frame 27/64]
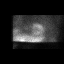
[frame 38/64]
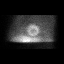
[frame 48/64]
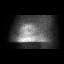
[frame 59/64]
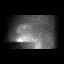

[Series 1: stress-gsp · 6.40mm/px · 6 of 509 frames shown]
[frame 43/509  full-range]
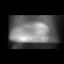
[frame 127/509  full-range]
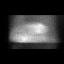
[frame 212/509  full-range]
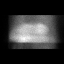
[frame 297/509  full-range]
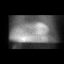
[frame 382/509  full-range]
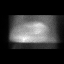
[frame 467/509  full-range]
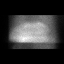

[42 of 42 positions shown; findings below may reference images not displayed]

Canned report from images found in remote index.

Refer to host system for actual result text.

## 2015-10-19 ENCOUNTER — Other Ambulatory Visit: Payer: Self-pay

## 2015-10-25 DIAGNOSIS — R972 Elevated prostate specific antigen [PSA]: Secondary | ICD-10-CM | POA: Diagnosis not present

## 2015-11-14 DIAGNOSIS — I10 Essential (primary) hypertension: Secondary | ICD-10-CM | POA: Diagnosis not present

## 2015-11-14 DIAGNOSIS — G4733 Obstructive sleep apnea (adult) (pediatric): Secondary | ICD-10-CM | POA: Diagnosis not present

## 2015-11-14 DIAGNOSIS — Z23 Encounter for immunization: Secondary | ICD-10-CM | POA: Diagnosis not present

## 2015-11-14 DIAGNOSIS — R972 Elevated prostate specific antigen [PSA]: Secondary | ICD-10-CM | POA: Diagnosis not present

## 2015-11-14 DIAGNOSIS — G629 Polyneuropathy, unspecified: Secondary | ICD-10-CM | POA: Diagnosis not present

## 2015-11-16 ENCOUNTER — Encounter: Payer: Self-pay | Admitting: Family Medicine

## 2015-11-19 DIAGNOSIS — C61 Malignant neoplasm of prostate: Secondary | ICD-10-CM | POA: Diagnosis not present

## 2015-11-19 DIAGNOSIS — R972 Elevated prostate specific antigen [PSA]: Secondary | ICD-10-CM | POA: Diagnosis not present

## 2015-11-19 DIAGNOSIS — D4959 Neoplasm of unspecified behavior of other genitourinary organ: Secondary | ICD-10-CM | POA: Diagnosis not present

## 2015-11-29 DIAGNOSIS — M79606 Pain in leg, unspecified: Secondary | ICD-10-CM | POA: Diagnosis not present

## 2015-11-29 DIAGNOSIS — M109 Gout, unspecified: Secondary | ICD-10-CM | POA: Diagnosis not present

## 2015-11-29 DIAGNOSIS — M1711 Unilateral primary osteoarthritis, right knee: Secondary | ICD-10-CM | POA: Diagnosis not present

## 2015-12-03 DIAGNOSIS — C61 Malignant neoplasm of prostate: Secondary | ICD-10-CM | POA: Diagnosis not present

## 2015-12-11 DIAGNOSIS — C61 Malignant neoplasm of prostate: Secondary | ICD-10-CM | POA: Diagnosis not present

## 2015-12-20 DIAGNOSIS — I10 Essential (primary) hypertension: Secondary | ICD-10-CM | POA: Diagnosis not present

## 2015-12-20 DIAGNOSIS — M109 Gout, unspecified: Secondary | ICD-10-CM | POA: Diagnosis not present

## 2015-12-20 DIAGNOSIS — C61 Malignant neoplasm of prostate: Secondary | ICD-10-CM | POA: Diagnosis not present

## 2015-12-20 DIAGNOSIS — M1711 Unilateral primary osteoarthritis, right knee: Secondary | ICD-10-CM | POA: Diagnosis not present

## 2016-01-14 ENCOUNTER — Telehealth: Payer: Self-pay | Admitting: Cardiology

## 2016-01-14 DIAGNOSIS — C61 Malignant neoplasm of prostate: Secondary | ICD-10-CM | POA: Diagnosis not present

## 2016-01-14 DIAGNOSIS — K219 Gastro-esophageal reflux disease without esophagitis: Secondary | ICD-10-CM | POA: Diagnosis not present

## 2016-01-14 DIAGNOSIS — K311 Adult hypertrophic pyloric stenosis: Secondary | ICD-10-CM | POA: Diagnosis not present

## 2016-01-14 DIAGNOSIS — Z8546 Personal history of malignant neoplasm of prostate: Secondary | ICD-10-CM | POA: Insufficient documentation

## 2016-01-14 DIAGNOSIS — E739 Lactose intolerance, unspecified: Secondary | ICD-10-CM | POA: Diagnosis not present

## 2016-01-14 DIAGNOSIS — K900 Celiac disease: Secondary | ICD-10-CM | POA: Diagnosis not present

## 2016-01-14 NOTE — Telephone Encounter (Signed)
°  New Prob    Request for surgical clearance:  1. What type of surgery is being performed? Proctectomy   2. When is this surgery scheduled? 02/20/2016   3. Are there any medications that need to be held prior to surgery and how long?  No   4. Name of physician performing surgery? Dr. Louanne Skye  5. What is your office phone and fax number? Pre Anesthesia testing [phone]    States pt had a pre-syncopal episode yesterday. Calling to verify if pt needs to be seen prior to surgery next week. Please call.

## 2016-01-15 NOTE — Telephone Encounter (Signed)
Follow up     Summer is calling again to speak to rn because she needs to know if pt needs to be seen in office for surgical clearance

## 2016-01-15 NOTE — Telephone Encounter (Signed)
Left message for Summer that Dr. Radford Pax is not in the office this week. That next week either the clearance will be faxed OR the patient will be scheduled for evaluation depending on Dr. Theodosia Blender recommendations.

## 2016-01-19 NOTE — Telephone Encounter (Signed)
If patient has not had any problems since I saw him last then he is stable and low risk from cardiac standpoint to proceed with surgery.

## 2016-01-21 ENCOUNTER — Telehealth: Payer: Self-pay | Admitting: Cardiology

## 2016-01-21 NOTE — Telephone Encounter (Signed)
See 11/20 phone encounter.

## 2016-01-21 NOTE — Telephone Encounter (Signed)
Spoke with Summer at [facility] again.  She states that the patient reported to her that he felt like he was going to faint while driving last week. She reports he even said the symptoms are different than what he gets with dumping syndrome. Agreed with Summer that the patient will need to be evaluated by Cardiology prior to having surgery.   Attempted to call patient to schedule appointment. Left message to call back.

## 2016-01-21 NOTE — Telephone Encounter (Signed)
Called patient to tell him about agreement between Cardiology and [place]. He states that [facility] already called him and informed him they would proceed cautiously without Cardiac clearance. Scheduled patient in January for yearly follow-up. He understands to call prior to appointment if he has any questions or concerns.

## 2016-01-21 NOTE — Telephone Encounter (Signed)
New message ° °Pt is returning call  ° °Please call back °

## 2016-01-21 NOTE — Telephone Encounter (Signed)
[name] Pt had pre-syncopal episode last week-wants to know if she needs to be seen prior to surgery tomorrow

## 2016-01-21 NOTE — Telephone Encounter (Signed)
Left message to call back  

## 2016-01-21 NOTE — Telephone Encounter (Signed)
Spoke with Summer at [facility] and informed her that patient will need to be seen prior to clearance given new symptoms. She understands the patient will be called to schedule and that the procedure may have to be postponed.  Called patient to discuss symptoms and to schedule office visit. He states he was never dizzy and has "no idea" what Summer was talking about.  Called Summer to clarify - left message to call back.

## 2016-01-21 NOTE — Telephone Encounter (Signed)
Follow up ° °Pt voiced returning call °

## 2016-01-22 DIAGNOSIS — K429 Umbilical hernia without obstruction or gangrene: Secondary | ICD-10-CM | POA: Diagnosis present

## 2016-01-22 DIAGNOSIS — K660 Peritoneal adhesions (postprocedural) (postinfection): Secondary | ICD-10-CM | POA: Diagnosis present

## 2016-01-22 DIAGNOSIS — E739 Lactose intolerance, unspecified: Secondary | ICD-10-CM | POA: Diagnosis present

## 2016-01-22 DIAGNOSIS — C61 Malignant neoplasm of prostate: Secondary | ICD-10-CM | POA: Diagnosis not present

## 2016-01-22 DIAGNOSIS — K219 Gastro-esophageal reflux disease without esophagitis: Secondary | ICD-10-CM | POA: Diagnosis present

## 2016-01-22 DIAGNOSIS — K9041 Non-celiac gluten sensitivity: Secondary | ICD-10-CM | POA: Diagnosis present

## 2016-01-22 DIAGNOSIS — Z86711 Personal history of pulmonary embolism: Secondary | ICD-10-CM | POA: Diagnosis not present

## 2016-02-01 DIAGNOSIS — C61 Malignant neoplasm of prostate: Secondary | ICD-10-CM | POA: Diagnosis not present

## 2016-02-08 DIAGNOSIS — K6389 Other specified diseases of intestine: Secondary | ICD-10-CM | POA: Diagnosis not present

## 2016-02-08 DIAGNOSIS — R140 Abdominal distension (gaseous): Secondary | ICD-10-CM | POA: Diagnosis not present

## 2016-02-08 DIAGNOSIS — R142 Eructation: Secondary | ICD-10-CM | POA: Diagnosis not present

## 2016-02-08 DIAGNOSIS — K219 Gastro-esophageal reflux disease without esophagitis: Secondary | ICD-10-CM | POA: Diagnosis not present

## 2016-03-03 ENCOUNTER — Encounter: Payer: Self-pay | Admitting: *Deleted

## 2016-03-07 DIAGNOSIS — H401122 Primary open-angle glaucoma, left eye, moderate stage: Secondary | ICD-10-CM | POA: Diagnosis not present

## 2016-03-18 ENCOUNTER — Ambulatory Visit (INDEPENDENT_AMBULATORY_CARE_PROVIDER_SITE_OTHER): Payer: Medicare Other | Admitting: Cardiology

## 2016-03-18 ENCOUNTER — Encounter: Payer: Self-pay | Admitting: Cardiology

## 2016-03-18 VITALS — BP 110/70 | HR 55 | Ht 71.0 in | Wt 265.0 lb

## 2016-03-18 DIAGNOSIS — L819 Disorder of pigmentation, unspecified: Secondary | ICD-10-CM | POA: Diagnosis not present

## 2016-03-18 DIAGNOSIS — R000 Tachycardia, unspecified: Secondary | ICD-10-CM

## 2016-03-18 DIAGNOSIS — R001 Bradycardia, unspecified: Secondary | ICD-10-CM

## 2016-03-18 DIAGNOSIS — I10 Essential (primary) hypertension: Secondary | ICD-10-CM | POA: Diagnosis not present

## 2016-03-18 NOTE — Progress Notes (Signed)
Cardiology Office Note    Date:  03/18/2016   ID:  [name], DOB 1950/10/13, MRN [id number]  PCP:  Rachell Cipro, MD  Cardiologist:  Fransico Him, MD   Chief Complaint  Patient presents with  . Follow-up    tachycardia and HTN    History of Present Illness:  [name] is a 66 y.o. male who presents for followup.  He has a history of LE pain with peripheral neuropathy.  He also has a long history of tachycardia dating back to a surgery when his nurse noted post op that he was tachycardic and was evaluated by Cardiology in [place] but declined followup. He underwent nuclear stress test which showed no ischemia and echo showed normal LVF with mild MR and TR. He is now back for follow-up. He denies any chest pain, SOB, DOE or syncope.  He has chronic LE edema which is stable.  He says that his HR has been maintaining in the mid 50's.  He denies any dizziness or fatigue.  He denies any racing or skipping of his heart beat. He complains that he has had discoloration of the great toe in his left foot and is concerned about circulation.     Past Medical History:  Diagnosis Date  . Anemia, unspecified   . BPH (benign prostatic hyperplasia)   . Depression   . Dizziness and giddiness   . Glaucoma   . Glaucoma   . Gout   . Gout   . Hypercholesteremia   . Hyperkalemia   . Hyperlipemia   . Hypertension   . Major depressive disorder   . Neuropathy ([place])   . Tachycardia     Past Surgical History:  Procedure Laterality Date  . HERNIA REPAIR     x 5  . MASTECTOMY Right   . SHOULDER SURGERY Right   . SMALL BOWEL REPAIR     for ulcer     Current Medications: Outpatient Medications Prior to Visit  Medication Sig Dispense Refill  . allopurinol (ZYLOPRIM) 100 MG tablet Take 100 mg by mouth 2 (two) times daily.     . DULoxetine (CYMBALTA) 60 MG capsule Take 60 mg by mouth daily.    . finasteride (PROSCAR) 5 MG tablet Take 5 mg by mouth daily.    Marland Kitchen loratadine (CLARITIN) 10 MG  tablet Take 10 mg by mouth daily.    . pantoprazole (PROTONIX) 40 MG tablet Take 40 mg by mouth daily.    . pindolol (VISKEN) 5 MG tablet Take 0.5 tablets (2.5 mg total) by mouth 2 (two) times daily. 90 tablet 3  . simvastatin (ZOCOR) 40 MG tablet Take 40 mg by mouth daily.    . timolol (TIMOPTIC) 0.5 % ophthalmic solution 1 drop 2 (two) times daily.     No facility-administered medications prior to visit.      Allergies:   Gabapentin; Gluten meal; and Lactose   Social History   Social History  . Marital status: Married    Spouse name: N/A  . Number of children: 1  . Years of education: Masters   Occupational History  . retired    Social History Main Topics  . Smoking status: Former Smoker    Quit date: 02/25/1980  . Smokeless tobacco: Never Used  . Alcohol use No  . Drug use: No  . Sexual activity: Not Asked   Other Topics Concern  . None   Social History Narrative   Rarely drinks caffeine  Family History:  The patient's family history includes Alzheimer's disease in his mother; Arrhythmia in his father; Dementia in his mother; Healthy in his brother, daughter, and sister; Heart disease in his father; Prostate cancer in his father; Pulmonary disease in his mother.   ROS:   Please see the history of present illness.    ROS All other systems reviewed and are negative.  No flowsheet data found.     PHYSICAL EXAM:   VS:  BP 110/70   Pulse (!) 55   Ht 5\' 11"  (1.803 m)   Wt 265 lb (120.2 kg)   BMI 36.96 kg/m    GEN: Well nourished, well developed, in no acute distress  HEENT: normal  Neck: no JVD, carotid bruits, or masses Cardiac: RRR; no murmurs, rubs, or gallops,no edema.  Intact distal pulses bilaterally.  Respiratory:  clear to auscultation bilaterally, normal work of breathing GI: soft, nontender, nondistended, + BS MS: no deformity or atrophy  Skin: warm and dry, no rash.  Mild duskiness of the left great toe Neuro:  Alert and Oriented x 3, Strength  and sensation are intact Psych: euthymic mood, full affect  Wt Readings from Last 3 Encounters:  03/18/16 265 lb (120.2 kg)  09/10/15 262 lb (118.8 kg)  03/09/15 250 lb (113.4 kg)      Studies/Labs Reviewed:   EKG:  EKG is ordered today.  The ekg ordered today demonstrates sinus bradycardia at 55bpm with no ST changes  Recent Labs: No results found for requested labs within last 8760 hours.   Lipid Panel No results found for: CHOL, TRIG, HDL, CHOLHDL, VLDL, LDLCALC, LDLDIRECT  Additional studies/ records that were reviewed today include:  none    ASSESSMENT:    1. Tachycardia   2. Benign essential HTN   3. Bradycardia   4. Discoloration of skin of toe      PLAN:  In order of problems listed above:  1. Tachycardia - resolved on Pindolol.   2. HTN - BP controlled on current meds.  Continue ARB and Pindolol. 3. Asymptomatic bradycardia - continue Pindolol for control of tachyarrhythmias.  HR is 55 and he is asymptomatic.  4. Toe discoloration of the left great toe with somewhat of a dusky hue but 1+ DP pulse is present in foot.  I will get LE arterial doppers to assess blood flow    Medication Adjustments/Labs and Tests Ordered: Current medicines are reviewed at length with the patient today.  Concerns regarding medicines are outlined above.  Medication changes, Labs and Tests ordered today are listed in the Patient Instructions below.  There are no Patient Instructions on file for this visit.   Signed, Fransico Him, MD  03/18/2016 9:15 AM    [address], [place], [place]  [address] Phone: [phone]; Fax: [phone]

## 2016-03-18 NOTE — Patient Instructions (Signed)
Medication Instructions:  Your physician recommends that you continue on your current medications as directed. Please refer to the Current Medication list given to you today.   Labwork: None  Testing/Procedures: Your physician has requested that you have a lower extremity arterial duplex AS SOON AS POSSIBLE. During this test, ultrasound is used to evaluate arterial blood flow in the legs. Allow one hour for this exam. There are no restrictions or special instructions.   Follow-Up: Your physician wants you to follow-up in: 1 year with Dr. Radford Pax. You will receive a reminder letter in the mail two months in advance. If you don't receive a letter, please call our office to schedule the follow-up appointment.   Any Other Special Instructions Will Be Listed Below (If Applicable).     If you need a refill on your cardiac medications before your next appointment, please call your pharmacy.

## 2016-03-19 ENCOUNTER — Other Ambulatory Visit: Payer: Self-pay | Admitting: Cardiology

## 2016-03-19 DIAGNOSIS — L819 Disorder of pigmentation, unspecified: Secondary | ICD-10-CM

## 2016-03-21 DIAGNOSIS — M6281 Muscle weakness (generalized): Secondary | ICD-10-CM | POA: Diagnosis not present

## 2016-03-21 DIAGNOSIS — N393 Stress incontinence (female) (male): Secondary | ICD-10-CM | POA: Diagnosis not present

## 2016-03-21 DIAGNOSIS — C61 Malignant neoplasm of prostate: Secondary | ICD-10-CM | POA: Diagnosis not present

## 2016-03-24 ENCOUNTER — Ambulatory Visit (HOSPITAL_COMMUNITY)
Admission: RE | Admit: 2016-03-24 | Discharge: 2016-03-24 | Disposition: A | Discharge status: Home / Self Care | Payer: Medicare Other | Source: Ambulatory Visit | Attending: Cardiovascular Disease | Admitting: Cardiovascular Disease

## 2016-03-24 DIAGNOSIS — Z87891 Personal history of nicotine dependence: Secondary | ICD-10-CM | POA: Diagnosis not present

## 2016-03-24 DIAGNOSIS — L819 Disorder of pigmentation, unspecified: Secondary | ICD-10-CM | POA: Diagnosis not present

## 2016-03-24 DIAGNOSIS — I10 Essential (primary) hypertension: Secondary | ICD-10-CM | POA: Diagnosis not present

## 2016-03-24 DIAGNOSIS — I739 Peripheral vascular disease, unspecified: Secondary | ICD-10-CM | POA: Insufficient documentation

## 2016-03-24 DIAGNOSIS — E785 Hyperlipidemia, unspecified: Secondary | ICD-10-CM | POA: Insufficient documentation

## 2016-04-08 DIAGNOSIS — M109 Gout, unspecified: Secondary | ICD-10-CM | POA: Diagnosis not present

## 2016-04-09 DIAGNOSIS — R000 Tachycardia, unspecified: Secondary | ICD-10-CM | POA: Diagnosis not present

## 2016-04-09 DIAGNOSIS — M109 Gout, unspecified: Secondary | ICD-10-CM | POA: Diagnosis not present

## 2016-04-09 DIAGNOSIS — I10 Essential (primary) hypertension: Secondary | ICD-10-CM | POA: Diagnosis not present

## 2016-04-09 DIAGNOSIS — R51 Headache: Secondary | ICD-10-CM | POA: Diagnosis not present

## 2016-04-25 DIAGNOSIS — M6281 Muscle weakness (generalized): Secondary | ICD-10-CM | POA: Diagnosis not present

## 2016-05-02 DIAGNOSIS — F458 Other somatoform disorders: Secondary | ICD-10-CM | POA: Diagnosis not present

## 2016-05-02 DIAGNOSIS — R140 Abdominal distension (gaseous): Secondary | ICD-10-CM | POA: Diagnosis not present

## 2016-05-08 ENCOUNTER — Ambulatory Visit (INDEPENDENT_AMBULATORY_CARE_PROVIDER_SITE_OTHER): Payer: Medicare Other

## 2016-05-08 ENCOUNTER — Other Ambulatory Visit: Payer: Self-pay | Admitting: Family Medicine

## 2016-05-08 DIAGNOSIS — R000 Tachycardia, unspecified: Secondary | ICD-10-CM

## 2016-05-08 DIAGNOSIS — R002 Palpitations: Secondary | ICD-10-CM

## 2016-05-08 DIAGNOSIS — R001 Bradycardia, unspecified: Secondary | ICD-10-CM | POA: Diagnosis not present

## 2016-05-09 DIAGNOSIS — K219 Gastro-esophageal reflux disease without esophagitis: Secondary | ICD-10-CM | POA: Diagnosis not present

## 2016-05-09 DIAGNOSIS — I10 Essential (primary) hypertension: Secondary | ICD-10-CM | POA: Diagnosis not present

## 2016-05-09 DIAGNOSIS — Z6839 Body mass index (BMI) 39.0-39.9, adult: Secondary | ICD-10-CM | POA: Diagnosis not present

## 2016-05-09 DIAGNOSIS — G629 Polyneuropathy, unspecified: Secondary | ICD-10-CM | POA: Diagnosis not present

## 2016-05-19 ENCOUNTER — Telehealth: Payer: Self-pay | Admitting: Cardiology

## 2016-05-19 NOTE — Telephone Encounter (Signed)
Please encourage him not to take a hot shower and not spend too much time in the shower

## 2016-05-19 NOTE — Telephone Encounter (Signed)
Returned call to patient.He stated his PCP ordered a monitor for dizzy spells.Stated he is having a problem with monitor's phone keeping a charge.Advised to call monitor company phone # listed on monitor box.Stated he had a dizzy spell yesterday while taking a shower and fell out of shower.Stated he did not pass out.Stated of course he did not have monitor on, but put monitor back on when he got finished in shower.Advised I will send message to Dr.Turner so she will know you are wearing a monitor.

## 2016-05-19 NOTE — Telephone Encounter (Signed)
[name] calling about his monitor, states that the "smartphone will not hold a charge throughout the day." Please call to discuss, thanks.

## 2016-05-19 NOTE — Telephone Encounter (Signed)
Returned call to patient Dr.Turner's advice given.

## 2016-05-30 DIAGNOSIS — M6281 Muscle weakness (generalized): Secondary | ICD-10-CM | POA: Diagnosis not present

## 2016-06-23 DIAGNOSIS — J329 Chronic sinusitis, unspecified: Secondary | ICD-10-CM | POA: Diagnosis not present

## 2016-06-23 DIAGNOSIS — R51 Headache: Secondary | ICD-10-CM | POA: Diagnosis not present

## 2016-06-23 DIAGNOSIS — R001 Bradycardia, unspecified: Secondary | ICD-10-CM | POA: Diagnosis not present

## 2016-06-23 DIAGNOSIS — Z6838 Body mass index (BMI) 38.0-38.9, adult: Secondary | ICD-10-CM | POA: Diagnosis not present

## 2016-06-30 DIAGNOSIS — C61 Malignant neoplasm of prostate: Secondary | ICD-10-CM | POA: Diagnosis not present

## 2016-07-03 ENCOUNTER — Other Ambulatory Visit: Payer: Self-pay | Admitting: Family Medicine

## 2016-07-03 DIAGNOSIS — M109 Gout, unspecified: Secondary | ICD-10-CM | POA: Diagnosis not present

## 2016-07-03 DIAGNOSIS — Z87891 Personal history of nicotine dependence: Secondary | ICD-10-CM

## 2016-07-03 DIAGNOSIS — R972 Elevated prostate specific antigen [PSA]: Secondary | ICD-10-CM | POA: Diagnosis not present

## 2016-07-03 DIAGNOSIS — Z1211 Encounter for screening for malignant neoplasm of colon: Secondary | ICD-10-CM | POA: Diagnosis not present

## 2016-07-03 DIAGNOSIS — R000 Tachycardia, unspecified: Secondary | ICD-10-CM | POA: Diagnosis not present

## 2016-07-03 DIAGNOSIS — Z0000 Encounter for general adult medical examination without abnormal findings: Secondary | ICD-10-CM | POA: Diagnosis not present

## 2016-07-03 DIAGNOSIS — I10 Essential (primary) hypertension: Secondary | ICD-10-CM | POA: Diagnosis not present

## 2016-07-17 ENCOUNTER — Other Ambulatory Visit: Payer: Medicare Other

## 2016-07-23 ENCOUNTER — Ambulatory Visit
Admission: RE | Admit: 2016-07-23 | Discharge: 2016-07-23 | Disposition: A | Discharge status: Home / Self Care | Payer: Medicare Other | Source: Ambulatory Visit | Attending: Family Medicine | Admitting: Family Medicine

## 2016-07-23 DIAGNOSIS — Z87891 Personal history of nicotine dependence: Secondary | ICD-10-CM

## 2016-07-23 DIAGNOSIS — Z136 Encounter for screening for cardiovascular disorders: Secondary | ICD-10-CM | POA: Diagnosis not present

## 2016-08-22 DIAGNOSIS — H401122 Primary open-angle glaucoma, left eye, moderate stage: Secondary | ICD-10-CM | POA: Diagnosis not present

## 2016-09-09 ENCOUNTER — Ambulatory Visit: Payer: Medicare Other | Admitting: Neurology

## 2016-09-29 DIAGNOSIS — C61 Malignant neoplasm of prostate: Secondary | ICD-10-CM | POA: Diagnosis not present

## 2016-09-29 DIAGNOSIS — N393 Stress incontinence (female) (male): Secondary | ICD-10-CM | POA: Diagnosis not present

## 2016-10-28 ENCOUNTER — Encounter: Payer: Self-pay | Admitting: Neurology

## 2016-10-30 ENCOUNTER — Encounter: Payer: Self-pay | Admitting: Neurology

## 2016-10-30 ENCOUNTER — Ambulatory Visit (INDEPENDENT_AMBULATORY_CARE_PROVIDER_SITE_OTHER): Payer: Medicare Other | Admitting: Neurology

## 2016-10-30 VITALS — BP 117/74 | HR 51 | Ht 71.0 in | Wt 263.0 lb

## 2016-10-30 DIAGNOSIS — G4733 Obstructive sleep apnea (adult) (pediatric): Secondary | ICD-10-CM

## 2016-10-30 DIAGNOSIS — Z9989 Dependence on other enabling machines and devices: Secondary | ICD-10-CM

## 2016-10-30 NOTE — Progress Notes (Signed)
Subjective:    Patient ID: [name] is a 66 y.o. male.  HPI     Interim history:   Mr. [name] is a 66 year old right-handed gentleman with an underlying medical history of allergic rhinitis, depression, glaucoma, gout, hypertension, hyperlipidemia, tremors, dizziness, prostate cancer and obesity, who presents for follow-up consultation of his obstructive sleep apnea, well established on CPAP therapy. The patient is unaccompanied today. I last saw him on 09/09/2016, at which he was compliant with CPAP. I suggested we see him back in a year.  Today, 10/30/2016 (all dictated new, as well as above notes, some dictation done in note pad or Word, outside of chart, may appear as copied):  I reviewed his CPAP compliance data from 09/29/2016 through 10/28/2016 which is a total of 30 days, during which time he used his machine every night with percent used days greater than 4 hours at 97%, indicating excellent compliance with an average usage of 8 hours and 15 minutes, residual AHI 1.4 per hour, leak on the higher side with the 95th percentile at 17.7 L/m on a pressure of 8 cm with EPR of 3. He reports doing well with CPAP, no sleep-related concerns or issues with the mask or machine. He does get updated supplies from his DME company, [facility] on a regular basis. He has been diagnosed with prostate cancer and is status post robotic-assisted laparoscopic prostatectomy with bilateral lymph node dissection in November 2017. He is followed by oncology at [facility]. Doing well in that regard, no longer on Proscar, no need for chemotherapy or radiation thankfully.  The patient's allergies, current medications, family history, past medical history, past social history, past surgical history and problem list were reviewed and updated as appropriate.   Previously (copied from previous notes for reference):   I saw him on 03/09/2015, at which time he was compliant with CPAP and we talked about his recent sleep  study results, including his baseline sleep study from 12/26/2014 as well as his CPAP titration study from 01/16/2015.  I first met him on 12/06/2014 at the request of his primary care physician, at which time the patient reported a prior diagnosis of obstructive sleep apnea and he was placed on CPAP therapy in the past but had issues with sleep disruption, dry mouth, and difficulty tolerating CPAP. I invited him back for sleep study. He had a baseline sleep study, followed by a CPAP titration study.   His baseline sleep study from 12/26/2014 showed a sleep efficiency of 88.5% with a latency to sleep of 7.5 minutes and wake after sleep onset of 41 minutes with moderate sleep fragmentation noted. He had an elevated arousal index. He had an increased percentage of stage II sleep, absence of slow-wave sleep and a decreased percentage of REM sleep with a markedly prolonged REM latency. He had moderate PLMS with minimal arousals. He had no significant EKG or EEG changes. Mild to moderate snoring was noted. Total AHI was 8.5 per hour, rising to 31.1 per hour during REM sleep and 34.5 per hour in the supine position. Average oxygen saturation was 93%, nadir was 76% during REM sleep, time below 88% saturation was 7 minutes for the study.    Based on his sleep related complaints and his study results I invited him back for a full night CPAP titration study. He had this on 01/16/2015: Sleep efficiency was 80%, latency to sleep was 28 minutes, wake after sleep onset was 74 minutes with moderate sleep fragmentation noted. He  had a high normal arousal index, normal percentages of light stage sleep, absence of slow-wave sleep and an increased percentage of REM sleep at 41.5% with a mildly prolonged REM latency of 145 minutes. He had mild PLMS with minimal arousals. Average oxygen saturation was 94%, nadir was 86%. Time below 90% saturation was 4 minutes and 53 seconds. CPAP was titrated from 5 cm to 8 cm. AHI was 0 per hour  on the final pressure with supine REM sleep achieved.   Based on his test results I prescribed CPAP therapy for home use.   12/06/2014: He was previously diagnosed with obstructive sleep apnea many years ago, almost 20 years ago, and he has been on CPAP for many years. He complains of sleep disruption, problems with mask leaking and dry mouth, and swallowing air. Prior sleep test results are not available for my review. Of note, he has lost weight since his original sleep apnea diagnosis, in the realm of 40 pounds since he first started treatment. I reviewed your office note from 12/01/2014, which you kindly included.  He has been purchasing his nasal pillows interface, L Swift FX and alternates with a L an Ultra Mirage II nasal mask. He does not always wake up rested. He has occasional nocturia but not nightly. He may have occasional restless leg symptoms and that he has an inner itching feeling in his feet. He was diagnosed with neuropathy. He has no history of diabetes. He drinks alcohol very rarely maybe once every few years. He used to drink beer regularly and in excess at times he admits. He quit drinking beer in 1992. He drinks caffeine in the form of coffee occasionally and occasional sodas but nothing daily. He quit smoking in 1982. He lives with his wife who has had some health issues of her own. He has one grown daughter and a 74-year-old grandchild. He is retired as of last year. He had 2 sleep studies in the past at [facility]. He is compliant with CPAP therapy. He has a second smaller CPAP unit for travel purposes. He does not use a humidifier. Epworth sleepiness score is 10 out of 24 today, his fatigue score is 45 out of 63.  His Past Medical History Is Significant For: Past Medical History:  Diagnosis Date  . Anemia, unspecified   . BPH (benign prostatic hyperplasia)   . Depression   . Dizziness and giddiness   . Glaucoma   . Glaucoma   . Gout   . Gout   . Hypercholesteremia    . Hyperkalemia   . Hyperlipemia   . Hypertension   . Major depressive disorder   . Neuropathy   . Tachycardia     His Past Surgical History Is Significant For: Past Surgical History:  Procedure Laterality Date  . HERNIA REPAIR     x 5  . MASTECTOMY Right   . SHOULDER SURGERY Right   . SMALL BOWEL REPAIR     for ulcer     His Family History Is Significant For: Family History  Problem Relation Age of Onset  . Alzheimer's disease Mother   . Dementia Mother   . Pulmonary disease Mother   . Heart disease Father   . Arrhythmia Father        pacemaker  . Prostate cancer Father   . Healthy Sister   . Healthy Brother   . Healthy Daughter     His Social History Is Significant For: Social History   Social History  .  Marital status: Married    Spouse name: N/A  . Number of children: 1  . Years of education: Masters   Occupational History  . retired    Social History Main Topics  . Smoking status: Former Smoker    Quit date: 02/25/1980  . Smokeless tobacco: Never Used  . Alcohol use No  . Drug use: No  . Sexual activity: Not Asked   Other Topics Concern  . None   Social History Narrative   Rarely drinks caffeine     His Allergies Are:  Allergies  Allergen Reactions  . Gabapentin Other (See Comments)    Exacerbated neuropathy symptoms  . Gluten Meal Diarrhea    Gas, bloating  . Lactose Diarrhea  :   His Current Medications Are:  Outpatient Encounter Prescriptions as of 10/30/2016  Medication Sig  . allopurinol (ZYLOPRIM) 100 MG tablet Take 100 mg by mouth 2 (two) times daily.   Marland Kitchen amitriptyline (ELAVIL) 25 MG tablet   . azelastine (ASTELIN) 0.1 % nasal spray   . DULoxetine (CYMBALTA) 60 MG capsule Take 60 mg by mouth daily.  Marland Kitchen loratadine (CLARITIN) 10 MG tablet Take 10 mg by mouth daily.  Marland Kitchen losartan (COZAAR) 50 MG tablet Take by mouth.  . pantoprazole (PROTONIX) 40 MG tablet Take 40 mg by mouth daily.  . pindolol (VISKEN) 5 MG tablet Take 0.5 tablets  (2.5 mg total) by mouth 2 (two) times daily.  . simvastatin (ZOCOR) 40 MG tablet Take 40 mg by mouth daily.  . timolol (TIMOPTIC) 0.5 % ophthalmic solution 1 drop 2 (two) times daily.  . [DISCONTINUED] finasteride (PROSCAR) 5 MG tablet Take 5 mg by mouth daily.   No facility-administered encounter medications on file as of 10/30/2016.   :  Review of Systems:  Out of a complete 14 point review of systems, all are reviewed and negative with the exception of these symptoms as listed below: Review of Systems  Neurological:       Pt presents today to discuss his cpap. Pt reports that his cpap is going well and that he uses it every night.    Objective:  Neurological Exam  Physical Exam Physical Examination:   Vitals:   10/30/16 1142  BP: 117/74  Pulse: (!) 51   General Examination: The patient is a very pleasant 66 y.o. male in no acute distress. He appears well-developed and well-nourished and well groomed.   HEENT: Normocephalic, atraumatic, pupils are equal, round and reactive to light and accommodation. Corrective eye glasses. Extraocular tracking is good without limitation to gaze excursion or nystagmus noted. Normal smooth pursuit is noted. Hearing is grossly intact. Face is symmetric with normal facial animation and normal facial sensation. Speech is clear with no dysarthria noted. There is no hypophonia. There is no lip, neck/head, jaw or voice tremor. Neck is supple with full range of passive and active motion. There are no carotid bruits on auscultation. Oropharynx exam reveals: mild mouth dryness, adequate dental hygiene, mild to moderate airway crowding secondary to redundant soft palate and narrow airway entry. Tonsils are absent. Mallampati is class II.  Chest: Clear to auscultation without wheezing, rhonchi or crackles noted.  Heart: S1+S2+0, regular and normal without murmurs, rubs or gallops noted.   Abdomen: Soft, non-tender and non-distended with normal bowel sounds  appreciated on auscultation.  Extremities: There is 1+ pitting puffiness bilaterally, right more than left. He recently scraped his right shin in his truck he reports. Mild residual bruising is noted.  Skin: Warm and dry without trophic changes noted. There are no varicose veins.  Musculoskeletal: exam reveals no obvious joint deformities, tenderness or joint swelling or erythema.   Neurologically:  Mental status: The patient is awake, alert and oriented in all 4 spheres. His immediate and remote memory, attention, language skills and fund of knowledge are appropriate. There is no evidence of aphasia, agnosia, apraxia or anomia. Speech is clear with normal prosody and enunciation. Thought process is linear. Mood is normal and affect is normal.  Cranial nerves II - XII are as described above under HEENT exam. In addition: shoulder shrug is normal with equal shoulder height noted. Motor exam: Normal bulk, strength and tone is noted. There is no drift, tremor or rebound. Reflexes are 1+ in the upper extremities and trace in the lower extremities. Fine motor skills are grossly intact. Sensory exam is intact to LT in the UE and LEs. He stands without problems. Posture is age-appropriate. Gait shows normal stride length and normal pace. No problems turning are noted.   Assessment and Plan:  In summary, KAMDON REISIG is a very pleasant 66 year old male with an underlying medical history of allergic rhinitis, depression, glaucoma, gout, hypertension, hyperlipidemia, tremors, dizziness, and obesity, who presents for follow-up consultation of his obstructive sleep apnea (most pronounced during REM sleep). He had a baseline sleep study and a CPAP titration study, both in November 2016 and has since then establish CPAP therapy. He is compliant with treatment and has ongoing good results with it. He is commended for his treatment adherence. His weight had gone down a couple of years ago when he has since  then had some weight gain. He has in the interim been diagnosed with prostate cancer and had prostatectomy in November 2017, is doing well in that regard. I placed an order for updated CPAP supplies. He is using nasal pillows. He receives supplies on a regular basis through his DME company. I suggested a one-year checkup, he can see one of our nurse practitioners next time. I answered all his questions today and he was in agreement. I spent 20 minutes in total face-to-face time with the patient, more than 50% of which was spent in counseling and coordination of care, reviewing test results, reviewing medication and discussing or reviewing the diagnosis of OSA, its prognosis and treatment options. Pertinent laboratory and imaging test results that were available during this visit with the patient were reviewed by me and considered in my medical decision making (see chart for details).

## 2016-10-30 NOTE — Patient Instructions (Signed)

## 2016-11-10 DIAGNOSIS — J069 Acute upper respiratory infection, unspecified: Secondary | ICD-10-CM | POA: Diagnosis not present

## 2016-11-10 DIAGNOSIS — J309 Allergic rhinitis, unspecified: Secondary | ICD-10-CM | POA: Diagnosis not present

## 2016-11-10 DIAGNOSIS — K469 Unspecified abdominal hernia without obstruction or gangrene: Secondary | ICD-10-CM | POA: Diagnosis not present

## 2016-11-10 DIAGNOSIS — Z23 Encounter for immunization: Secondary | ICD-10-CM | POA: Diagnosis not present

## 2016-12-29 DIAGNOSIS — E785 Hyperlipidemia, unspecified: Secondary | ICD-10-CM | POA: Diagnosis not present

## 2016-12-29 DIAGNOSIS — M109 Gout, unspecified: Secondary | ICD-10-CM | POA: Diagnosis not present

## 2016-12-29 DIAGNOSIS — I10 Essential (primary) hypertension: Secondary | ICD-10-CM | POA: Diagnosis not present

## 2016-12-29 DIAGNOSIS — Z79899 Other long term (current) drug therapy: Secondary | ICD-10-CM | POA: Diagnosis not present

## 2016-12-29 DIAGNOSIS — K219 Gastro-esophageal reflux disease without esophagitis: Secondary | ICD-10-CM | POA: Diagnosis not present

## 2016-12-31 DIAGNOSIS — R739 Hyperglycemia, unspecified: Secondary | ICD-10-CM | POA: Diagnosis not present

## 2016-12-31 DIAGNOSIS — M109 Gout, unspecified: Secondary | ICD-10-CM | POA: Diagnosis not present

## 2016-12-31 DIAGNOSIS — K469 Unspecified abdominal hernia without obstruction or gangrene: Secondary | ICD-10-CM | POA: Diagnosis not present

## 2016-12-31 DIAGNOSIS — R000 Tachycardia, unspecified: Secondary | ICD-10-CM | POA: Diagnosis not present

## 2016-12-31 DIAGNOSIS — K219 Gastro-esophageal reflux disease without esophagitis: Secondary | ICD-10-CM | POA: Diagnosis not present

## 2016-12-31 DIAGNOSIS — I10 Essential (primary) hypertension: Secondary | ICD-10-CM | POA: Diagnosis not present

## 2017-01-12 DIAGNOSIS — R51 Headache: Secondary | ICD-10-CM | POA: Diagnosis not present

## 2017-01-12 DIAGNOSIS — H04123 Dry eye syndrome of bilateral lacrimal glands: Secondary | ICD-10-CM | POA: Diagnosis not present

## 2017-01-12 DIAGNOSIS — I10 Essential (primary) hypertension: Secondary | ICD-10-CM | POA: Diagnosis not present

## 2017-01-12 DIAGNOSIS — K219 Gastro-esophageal reflux disease without esophagitis: Secondary | ICD-10-CM | POA: Diagnosis not present

## 2017-01-12 DIAGNOSIS — R000 Tachycardia, unspecified: Secondary | ICD-10-CM | POA: Diagnosis not present

## 2017-01-12 DIAGNOSIS — H401131 Primary open-angle glaucoma, bilateral, mild stage: Secondary | ICD-10-CM | POA: Diagnosis not present

## 2017-01-14 DIAGNOSIS — K5900 Constipation, unspecified: Secondary | ICD-10-CM | POA: Diagnosis not present

## 2017-01-14 DIAGNOSIS — K219 Gastro-esophageal reflux disease without esophagitis: Secondary | ICD-10-CM | POA: Diagnosis not present

## 2017-01-14 DIAGNOSIS — R001 Bradycardia, unspecified: Secondary | ICD-10-CM | POA: Diagnosis not present

## 2017-01-14 DIAGNOSIS — I10 Essential (primary) hypertension: Secondary | ICD-10-CM | POA: Diagnosis not present

## 2017-03-11 DIAGNOSIS — R001 Bradycardia, unspecified: Secondary | ICD-10-CM | POA: Diagnosis not present

## 2017-03-11 DIAGNOSIS — R51 Headache: Secondary | ICD-10-CM | POA: Diagnosis not present

## 2017-03-11 DIAGNOSIS — K219 Gastro-esophageal reflux disease without esophagitis: Secondary | ICD-10-CM | POA: Diagnosis not present

## 2017-03-11 DIAGNOSIS — I10 Essential (primary) hypertension: Secondary | ICD-10-CM | POA: Diagnosis not present

## 2017-03-11 DIAGNOSIS — R109 Unspecified abdominal pain: Secondary | ICD-10-CM | POA: Diagnosis not present

## 2017-04-27 DIAGNOSIS — Z6839 Body mass index (BMI) 39.0-39.9, adult: Secondary | ICD-10-CM | POA: Diagnosis not present

## 2017-04-27 DIAGNOSIS — E785 Hyperlipidemia, unspecified: Secondary | ICD-10-CM | POA: Diagnosis not present

## 2017-04-27 DIAGNOSIS — F329 Major depressive disorder, single episode, unspecified: Secondary | ICD-10-CM | POA: Diagnosis not present

## 2017-04-27 DIAGNOSIS — K219 Gastro-esophageal reflux disease without esophagitis: Secondary | ICD-10-CM | POA: Diagnosis not present

## 2017-05-11 DIAGNOSIS — C61 Malignant neoplasm of prostate: Secondary | ICD-10-CM | POA: Diagnosis not present

## 2017-05-11 DIAGNOSIS — N393 Stress incontinence (female) (male): Secondary | ICD-10-CM | POA: Diagnosis not present

## 2017-05-11 DIAGNOSIS — N5203 Combined arterial insufficiency and corporo-venous occlusive erectile dysfunction: Secondary | ICD-10-CM | POA: Diagnosis not present

## 2017-05-12 DIAGNOSIS — H524 Presbyopia: Secondary | ICD-10-CM | POA: Diagnosis not present

## 2017-05-12 DIAGNOSIS — H401122 Primary open-angle glaucoma, left eye, moderate stage: Secondary | ICD-10-CM | POA: Diagnosis not present

## 2017-05-26 DIAGNOSIS — H4912 Fourth [trochlear] nerve palsy, left eye: Secondary | ICD-10-CM | POA: Diagnosis not present

## 2017-05-27 DIAGNOSIS — H4912 Fourth [trochlear] nerve palsy, left eye: Secondary | ICD-10-CM | POA: Diagnosis not present

## 2017-05-27 DIAGNOSIS — R634 Abnormal weight loss: Secondary | ICD-10-CM | POA: Diagnosis not present

## 2017-05-27 DIAGNOSIS — K219 Gastro-esophageal reflux disease without esophagitis: Secondary | ICD-10-CM | POA: Diagnosis not present

## 2017-05-27 DIAGNOSIS — H4910 Fourth [trochlear] nerve palsy, unspecified eye: Secondary | ICD-10-CM | POA: Diagnosis not present

## 2017-05-27 DIAGNOSIS — Z6836 Body mass index (BMI) 36.0-36.9, adult: Secondary | ICD-10-CM | POA: Diagnosis not present

## 2017-05-28 ENCOUNTER — Encounter: Payer: Self-pay | Admitting: Gastroenterology

## 2017-06-02 ENCOUNTER — Encounter (HOSPITAL_COMMUNITY): Payer: Self-pay

## 2017-06-02 ENCOUNTER — Other Ambulatory Visit: Payer: Self-pay

## 2017-06-02 ENCOUNTER — Emergency Department (HOSPITAL_COMMUNITY): Payer: Medicare Other

## 2017-06-02 ENCOUNTER — Emergency Department (HOSPITAL_COMMUNITY)
Admission: EM | Admit: 2017-06-02 | Discharge: 2017-06-02 | Disposition: A | Discharge status: Home / Self Care | Payer: Medicare Other | Attending: Emergency Medicine | Admitting: Emergency Medicine

## 2017-06-02 DIAGNOSIS — I10 Essential (primary) hypertension: Secondary | ICD-10-CM | POA: Insufficient documentation

## 2017-06-02 DIAGNOSIS — H532 Diplopia: Secondary | ICD-10-CM | POA: Insufficient documentation

## 2017-06-02 DIAGNOSIS — R2681 Unsteadiness on feet: Secondary | ICD-10-CM | POA: Diagnosis not present

## 2017-06-02 DIAGNOSIS — H5500 Unspecified nystagmus: Secondary | ICD-10-CM | POA: Diagnosis not present

## 2017-06-02 DIAGNOSIS — H5000 Unspecified esotropia: Secondary | ICD-10-CM | POA: Diagnosis not present

## 2017-06-02 DIAGNOSIS — Z79899 Other long term (current) drug therapy: Secondary | ICD-10-CM | POA: Insufficient documentation

## 2017-06-02 DIAGNOSIS — Z87891 Personal history of nicotine dependence: Secondary | ICD-10-CM | POA: Insufficient documentation

## 2017-06-02 LAB — COMPREHENSIVE METABOLIC PANEL
ALK PHOS: 87 U/L (ref 38–126)
ALT: 17 U/L (ref 17–63)
ANION GAP: 13 (ref 5–15)
AST: 24 U/L (ref 15–41)
Albumin: 4.3 g/dL (ref 3.5–5.0)
BILIRUBIN TOTAL: 0.8 mg/dL (ref 0.3–1.2)
BUN: 17 mg/dL (ref 6–20)
CALCIUM: 9.8 mg/dL (ref 8.9–10.3)
CO2: 27 mmol/L (ref 22–32)
Chloride: 97 mmol/L — ABNORMAL LOW (ref 101–111)
Creatinine, Ser: 1.1 mg/dL (ref 0.61–1.24)
GFR calc Af Amer: >60 mL/min (ref >60)
Glucose, Bld: 160 mg/dL — ABNORMAL HIGH (ref 65–99)
POTASSIUM: 4.9 mmol/L (ref 3.5–5.1)
Sodium: 137 mmol/L (ref 135–145)
TOTAL PROTEIN: 7.4 g/dL (ref 6.5–8.1)

## 2017-06-02 LAB — CBC
HEMATOCRIT: 45.2 % (ref 39.0–52.0)
HEMOGLOBIN: 15.5 g/dL (ref 13.0–17.0)
MCH: 30.7 pg (ref 26.0–34.0)
MCHC: 34.3 g/dL (ref 30.0–36.0)
MCV: 89.5 fL (ref 78.0–100.0)
Platelets: 183 10*3/uL (ref 150–400)
RBC: 5.05 MIL/uL (ref 4.22–5.81)
RDW: 13.7 % (ref 11.5–15.5)
WBC: 7.5 10*3/uL (ref 4.0–10.5)

## 2017-06-02 LAB — APTT: aPTT: 26 seconds (ref 24–36)

## 2017-06-02 LAB — DIFFERENTIAL
Basophils Absolute: 0 10*3/uL (ref 0.0–0.1)
Basophils Relative: 0 %
EOS ABS: 0.1 10*3/uL (ref 0.0–0.7)
EOS PCT: 1 %
LYMPHS ABS: 1.4 10*3/uL (ref 0.7–4.0)
LYMPHS PCT: 19 %
MONOS PCT: 6 %
Monocytes Absolute: 0.4 10*3/uL (ref 0.1–1.0)
Neutro Abs: 5.6 10*3/uL (ref 1.7–7.7)
Neutrophils Relative %: 74 %

## 2017-06-02 LAB — I-STAT TROPONIN, ED: TROPONIN I, POC: 0 ng/mL (ref 0.00–0.08)

## 2017-06-02 LAB — PROTIME-INR
INR: 0.94
Prothrombin Time: 12.5 seconds (ref 11.4–15.2)

## 2017-06-02 MED ORDER — GADOBENATE DIMEGLUMINE 529 MG/ML IV SOLN
20.0000 mL | Freq: Once | INTRAVENOUS | Status: AC
  Administered 2017-06-02: 20 mL via INTRAVENOUS

## 2017-06-02 NOTE — ED Notes (Signed)
Patient transported to MRI 

## 2017-06-02 NOTE — Discharge Instructions (Addendum)
CLINICAL DATA:  Double vision. Cranial nerve palsy or stroke. Worsening balance.  EXAM: MRI HEAD WITHOUT AND WITH CONTRAST  TECHNIQUE: Multiplanar, multiecho pulse sequences of the brain and surrounding structures were obtained without and with intravenous contrast.  CONTRAST:  77mL MULTIHANCE GADOBENATE DIMEGLUMINE 529 MG/ML IV SOLN  COMPARISON:  None.  FINDINGS: BRAIN: The midline structures are normal. There is no acute infarct or acute hemorrhage. No mass lesion, hydrocephalus, dural abnormality or extra-axial collection. Minimal white matter hyperintensity, nonspecific and commonly seen in asymptomatic patients of this age. No age-advanced or lobar predominant atrophy. No chronic microhemorrhage or superficial siderosis. No contrast-enhancing lesions.  VASCULAR: Major intracranial arterial and venous sinus flow voids are preserved.  SKULL AND UPPER CERVICAL SPINE: The visualized skull base, calvarium, upper cervical spine and extracranial soft tissues are normal.  SINUSES/ORBITS: No fluid levels or advanced mucosal thickening. No mastoid or middle ear effusion. Normal orbits.  IMPRESSION: Normal aging brain.  No findings to account for reported diplopia.     Your MRI brain was normal. Please follow up with your ophthalmologist. Return to ER if you have any stroke like symptoms, severe headache, or sudden changes of your symptoms.

## 2017-06-02 NOTE — ED Notes (Signed)
ED Provider at bedside. 

## 2017-06-02 NOTE — ED Notes (Signed)
Reporting double vision for about 10 days.   Visited PCP, and opthomologist  Few days ago. Eye doctor told him today that his eyes seemed worse than a week ago.  He was asked to come here for MRI. Reports His balance/gait seemed worse He also reports that he lost 14lbs in a month without trying.

## 2017-06-02 NOTE — ED Provider Notes (Signed)
I received this patient in signout from Dr. Tomi Bamberger. We were awaiting brain MRI as w/u for diplopia.  MR brain was normal.  Patient well-appearing on reassessment.  I discussed workup findings and follow-up plan with his ophthalmologist.  Reviewed return precautions.  Patient voiced understanding.   Earleen Aoun, Wenda Overland, MD 06/02/17 [phone]

## 2017-06-02 NOTE — ED Triage Notes (Addendum)
Patient sent by ophthalmology for double vision. This started 9 days ago and was sent for EDP evaluation and possible MRI-states that the ophthalmologist is concerned he may have had a stroke. Complains of dizziness for same. Denies trauma. No pain Patient does not want CT because he states that he was sent for MRI and doesn't want both-NAD

## 2017-06-02 NOTE — ED Provider Notes (Signed)
[facility] EMERGENCY DEPARTMENT Provider Note   CSN: [id number] Arrival date & time: 06/02/17  1102     History   Chief Complaint Chief Complaint  Patient presents with  . needs MRI/eye issue    HPI [name] is a 67 y.o. male.  HPI Pt started having trouble with double vision about a week ago.  HE saw his ophthalmologist and was diagnosed with a possible cranial nerve palsy.  He was seen again today and his ophthalmologist was concerned about a possible stroke.    Pt also felt like his balance has worsened over the week.  Past Medical History:  Diagnosis Date  . Anemia, unspecified   . BPH (benign prostatic hyperplasia)   . Depression   . Dizziness and giddiness   . Glaucoma   . Glaucoma   . Gout   . Gout   . Hypercholesteremia   . Hyperkalemia   . Hyperlipemia   . Hypertension   . Major depressive disorder   . Neuropathy   . Tachycardia     Patient Active Problem List   Diagnosis Date Noted  . Discoloration of skin of toe 03/18/2016  . Tachycardia 03/02/2015  . Bradycardia 08/03/2014  . Palpitations 06/26/2014  . Benign essential HTN 06/26/2014  . Fatigue 06/26/2014    Past Surgical History:  Procedure Laterality Date  . HERNIA REPAIR     x 5  . MASTECTOMY Right   . SHOULDER SURGERY Right   . SMALL BOWEL REPAIR     for ulcer         Home Medications    Prior to Admission medications   Medication Sig Start Date End Date Taking? Authorizing Provider  allopurinol (ZYLOPRIM) 100 MG tablet Take 100 mg by mouth 2 (two) times daily.     [provider]  amitriptyline (ELAVIL) 25 MG tablet  03/06/16   [provider]  azelastine (ASTELIN) 0.1 % nasal spray  01/04/16   [provider]  DULoxetine (CYMBALTA) 60 MG capsule Take 60 mg by mouth daily.    [provider]  loratadine (CLARITIN) 10 MG tablet Take 10 mg by mouth daily.    [provider]  losartan (COZAAR) 50 MG tablet Take by  mouth. 11/14/15   [provider]  pantoprazole (PROTONIX) 40 MG tablet Take 40 mg by mouth daily.    [provider]  pindolol (VISKEN) 5 MG tablet Take 0.5 tablets (2.5 mg total) by mouth 2 (two) times daily. 03/02/15   Sueanne Margarita, MD  simvastatin (ZOCOR) 40 MG tablet Take 40 mg by mouth daily.    [provider]  timolol (TIMOPTIC) 0.5 % ophthalmic solution 1 drop 2 (two) times daily.    [provider]    Family History Family History  Problem Relation Age of Onset  . Alzheimer's disease Mother   . Dementia Mother   . Pulmonary disease Mother   . Heart disease Father   . Arrhythmia Father        pacemaker  . Prostate cancer Father   . Healthy Sister   . Healthy Brother   . Healthy Daughter     Social History Social History   Tobacco Use  . Smoking status: Former Smoker    Last attempt to quit: 02/25/1980    Years since quitting: 37.2  . Smokeless tobacco: Never Used  Substance Use Topics  . Alcohol use: No    Alcohol/week: 0.0 oz  . Drug  use: No     Allergies   Gabapentin; Gluten meal; and Lactose   Review of Systems Review of Systems  All other systems reviewed and are negative.    Physical Exam Updated Vital Signs BP 124/80   Pulse 60   SpO2 96%   Physical Exam  Constitutional: He appears well-developed and well-nourished. No distress.  HENT:  Head: Normocephalic and atraumatic.  Right Ear: External ear normal.  Left Ear: External ear normal.  Eyes: Conjunctivae are normal. Right eye exhibits no discharge. Left eye exhibits no discharge. No scleral icterus.  Neck: Neck supple. No tracheal deviation present.  Cardiovascular: Normal rate, regular rhythm and intact distal pulses.  Pulmonary/Chest: Effort normal and breath sounds normal. No stridor. No respiratory distress. He has no wheezes. He has no rales.  Abdominal: Soft. Bowel sounds are normal. He exhibits no distension. There is no tenderness. There is no  rebound and no guarding.  Musculoskeletal: He exhibits no edema or tenderness.  Neurological: He is alert. He has normal strength. No cranial nerve deficit (no facial droop, no slurred speech , nystagmus, ?difficulty with EOM) or sensory deficit. He exhibits normal muscle tone. He displays no seizure activity.  Unable to do finger to nose with eye patch  Skin: Skin is warm and dry. No rash noted.  Psychiatric: He has a normal mood and affect.  Nursing note and vitals reviewed.    ED Treatments / Results  Labs (all labs ordered are listed, but only abnormal results are displayed) Labs Reviewed  COMPREHENSIVE METABOLIC PANEL - Abnormal; Notable for the following components:      Result Value   Chloride 97 (*)    Glucose, Bld 160 (*)    All other components within normal limits  PROTIME-INR  APTT  CBC  DIFFERENTIAL  I-STAT TROPONIN, ED    Radiology MRI pending Procedures Procedures (including critical care time)  Medications Ordered in ED Medications - No data to display   Initial Impression / Assessment and Plan / ED Course  I have reviewed the triage vital signs and the nursing notes.  Pertinent labs & imaging results that were available during my care of the patient were reviewed by me and considered in my medical decision making (see chart for details).   Pt is here for an MRI to evaluate for possible CNS cause of his visual difficulties and balance issues.  MRI ordered.  Will turn over case to Dr Rex Kras to follow up on MRI test. Final Clinical Impressions(s) / ED Diagnoses  pending   Dorie Rank, MD 06/02/17 [phone]

## 2017-06-03 DIAGNOSIS — K219 Gastro-esophageal reflux disease without esophagitis: Secondary | ICD-10-CM | POA: Diagnosis not present

## 2017-06-03 DIAGNOSIS — K279 Peptic ulcer, site unspecified, unspecified as acute or chronic, without hemorrhage or perforation: Secondary | ICD-10-CM | POA: Diagnosis not present

## 2017-06-04 DIAGNOSIS — K210 Gastro-esophageal reflux disease with esophagitis: Secondary | ICD-10-CM | POA: Diagnosis not present

## 2017-06-04 DIAGNOSIS — R1110 Vomiting, unspecified: Secondary | ICD-10-CM | POA: Diagnosis not present

## 2017-06-04 DIAGNOSIS — R142 Eructation: Secondary | ICD-10-CM | POA: Diagnosis not present

## 2017-06-08 ENCOUNTER — Other Ambulatory Visit: Payer: Self-pay | Admitting: Gastroenterology

## 2017-06-08 DIAGNOSIS — K3189 Other diseases of stomach and duodenum: Secondary | ICD-10-CM

## 2017-06-09 ENCOUNTER — Observation Stay (HOSPITAL_COMMUNITY): Payer: Medicare Other

## 2017-06-09 ENCOUNTER — Encounter (HOSPITAL_COMMUNITY): Payer: Self-pay | Admitting: Emergency Medicine

## 2017-06-09 ENCOUNTER — Emergency Department (HOSPITAL_COMMUNITY): Payer: Medicare Other

## 2017-06-09 ENCOUNTER — Inpatient Hospital Stay (HOSPITAL_COMMUNITY)
Admission: EM | Admit: 2017-06-09 | Discharge: 2017-06-15 | DRG: 641 | Disposition: A | Discharge status: Skilled Nursing Facility | Payer: Medicare Other | Attending: Internal Medicine | Admitting: Internal Medicine

## 2017-06-09 DIAGNOSIS — Z91011 Allergy to milk products: Secondary | ICD-10-CM

## 2017-06-09 DIAGNOSIS — Z8249 Family history of ischemic heart disease and other diseases of the circulatory system: Secondary | ICD-10-CM

## 2017-06-09 DIAGNOSIS — Z8042 Family history of malignant neoplasm of prostate: Secondary | ICD-10-CM

## 2017-06-09 DIAGNOSIS — H4923 Sixth [abducent] nerve palsy, bilateral: Secondary | ICD-10-CM | POA: Diagnosis present

## 2017-06-09 DIAGNOSIS — R17 Unspecified jaundice: Secondary | ICD-10-CM | POA: Diagnosis not present

## 2017-06-09 DIAGNOSIS — K219 Gastro-esophageal reflux disease without esophagitis: Secondary | ICD-10-CM | POA: Diagnosis present

## 2017-06-09 DIAGNOSIS — R1110 Vomiting, unspecified: Secondary | ICD-10-CM | POA: Diagnosis not present

## 2017-06-09 DIAGNOSIS — R42 Dizziness and giddiness: Secondary | ICD-10-CM | POA: Diagnosis not present

## 2017-06-09 DIAGNOSIS — H532 Diplopia: Secondary | ICD-10-CM | POA: Diagnosis not present

## 2017-06-09 DIAGNOSIS — Z79899 Other long term (current) drug therapy: Secondary | ICD-10-CM

## 2017-06-09 DIAGNOSIS — H5509 Other forms of nystagmus: Secondary | ICD-10-CM | POA: Diagnosis present

## 2017-06-09 DIAGNOSIS — Z9181 History of falling: Secondary | ICD-10-CM

## 2017-06-09 DIAGNOSIS — K2210 Ulcer of esophagus without bleeding: Secondary | ICD-10-CM | POA: Diagnosis not present

## 2017-06-09 DIAGNOSIS — H3530 Unspecified macular degeneration: Secondary | ICD-10-CM | POA: Diagnosis present

## 2017-06-09 DIAGNOSIS — R531 Weakness: Secondary | ICD-10-CM

## 2017-06-09 DIAGNOSIS — E669 Obesity, unspecified: Secondary | ICD-10-CM | POA: Diagnosis present

## 2017-06-09 DIAGNOSIS — I10 Essential (primary) hypertension: Secondary | ICD-10-CM | POA: Diagnosis present

## 2017-06-09 DIAGNOSIS — R112 Nausea with vomiting, unspecified: Secondary | ICD-10-CM

## 2017-06-09 DIAGNOSIS — Z9989 Dependence on other enabling machines and devices: Secondary | ICD-10-CM

## 2017-06-09 DIAGNOSIS — R634 Abnormal weight loss: Secondary | ICD-10-CM | POA: Diagnosis present

## 2017-06-09 DIAGNOSIS — Z836 Family history of other diseases of the respiratory system: Secondary | ICD-10-CM

## 2017-06-09 DIAGNOSIS — Z87891 Personal history of nicotine dependence: Secondary | ICD-10-CM

## 2017-06-09 DIAGNOSIS — E860 Dehydration: Secondary | ICD-10-CM | POA: Diagnosis not present

## 2017-06-09 DIAGNOSIS — Z6835 Body mass index (BMI) 35.0-35.9, adult: Secondary | ICD-10-CM

## 2017-06-09 DIAGNOSIS — M1A0420 Idiopathic chronic gout, left hand, without tophus (tophi): Secondary | ICD-10-CM | POA: Diagnosis present

## 2017-06-09 DIAGNOSIS — E785 Hyperlipidemia, unspecified: Secondary | ICD-10-CM | POA: Diagnosis present

## 2017-06-09 DIAGNOSIS — H5500 Unspecified nystagmus: Secondary | ICD-10-CM | POA: Diagnosis not present

## 2017-06-09 DIAGNOSIS — Z820 Family history of epilepsy and other diseases of the nervous system: Secondary | ICD-10-CM

## 2017-06-09 DIAGNOSIS — Z8711 Personal history of peptic ulcer disease: Secondary | ICD-10-CM

## 2017-06-09 DIAGNOSIS — Z8546 Personal history of malignant neoplasm of prostate: Secondary | ICD-10-CM

## 2017-06-09 DIAGNOSIS — R1111 Vomiting without nausea: Secondary | ICD-10-CM | POA: Diagnosis not present

## 2017-06-09 DIAGNOSIS — F329 Major depressive disorder, single episode, unspecified: Secondary | ICD-10-CM | POA: Diagnosis present

## 2017-06-09 DIAGNOSIS — Z6834 Body mass index (BMI) 34.0-34.9, adult: Secondary | ICD-10-CM | POA: Diagnosis not present

## 2017-06-09 DIAGNOSIS — R142 Eructation: Secondary | ICD-10-CM

## 2017-06-09 DIAGNOSIS — I471 Supraventricular tachycardia: Secondary | ICD-10-CM | POA: Diagnosis not present

## 2017-06-09 DIAGNOSIS — G629 Polyneuropathy, unspecified: Secondary | ICD-10-CM | POA: Diagnosis not present

## 2017-06-09 DIAGNOSIS — M1711 Unilateral primary osteoarthritis, right knee: Secondary | ICD-10-CM | POA: Diagnosis present

## 2017-06-09 DIAGNOSIS — E119 Type 2 diabetes mellitus without complications: Secondary | ICD-10-CM | POA: Diagnosis not present

## 2017-06-09 DIAGNOSIS — Z9884 Bariatric surgery status: Secondary | ICD-10-CM

## 2017-06-09 DIAGNOSIS — K449 Diaphragmatic hernia without obstruction or gangrene: Secondary | ICD-10-CM | POA: Diagnosis present

## 2017-06-09 DIAGNOSIS — R739 Hyperglycemia, unspecified: Secondary | ICD-10-CM | POA: Diagnosis not present

## 2017-06-09 DIAGNOSIS — E512 Wernicke's encephalopathy: Secondary | ICD-10-CM | POA: Diagnosis not present

## 2017-06-09 DIAGNOSIS — G4733 Obstructive sleep apnea (adult) (pediatric): Secondary | ICD-10-CM | POA: Diagnosis present

## 2017-06-09 DIAGNOSIS — M1A0410 Idiopathic chronic gout, right hand, without tophus (tophi): Secondary | ICD-10-CM | POA: Diagnosis present

## 2017-06-09 DIAGNOSIS — Z91018 Allergy to other foods: Secondary | ICD-10-CM

## 2017-06-09 DIAGNOSIS — H409 Unspecified glaucoma: Secondary | ICD-10-CM | POA: Diagnosis present

## 2017-06-09 DIAGNOSIS — M109 Gout, unspecified: Secondary | ICD-10-CM | POA: Diagnosis present

## 2017-06-09 DIAGNOSIS — K900 Celiac disease: Secondary | ICD-10-CM | POA: Diagnosis present

## 2017-06-09 DIAGNOSIS — F32A Depression, unspecified: Secondary | ICD-10-CM | POA: Diagnosis present

## 2017-06-09 DIAGNOSIS — R000 Tachycardia, unspecified: Secondary | ICD-10-CM | POA: Diagnosis not present

## 2017-06-09 DIAGNOSIS — H9193 Unspecified hearing loss, bilateral: Secondary | ICD-10-CM | POA: Diagnosis present

## 2017-06-09 DIAGNOSIS — Z888 Allergy status to other drugs, medicaments and biological substances status: Secondary | ICD-10-CM

## 2017-06-09 DIAGNOSIS — H4922 Sixth [abducent] nerve palsy, left eye: Secondary | ICD-10-CM | POA: Diagnosis not present

## 2017-06-09 LAB — BASIC METABOLIC PANEL
ANION GAP: 11 (ref 5–15)
BUN: 10 mg/dL (ref 6–20)
CHLORIDE: 99 mmol/L — AB (ref 101–111)
CO2: 24 mmol/L (ref 22–32)
Calcium: 9.9 mg/dL (ref 8.9–10.3)
Creatinine, Ser: 1.14 mg/dL (ref 0.61–1.24)
GFR calc Af Amer: >60 mL/min (ref >60)
GLUCOSE: 177 mg/dL — AB (ref 65–99)
POTASSIUM: 4.3 mmol/L (ref 3.5–5.1)
SODIUM: 134 mmol/L — AB (ref 135–145)

## 2017-06-09 LAB — HEPATIC FUNCTION PANEL
ALBUMIN: 4.2 g/dL (ref 3.5–5.0)
ALT: 15 U/L — ABNORMAL LOW (ref 17–63)
AST: 20 U/L (ref 15–41)
Alkaline Phosphatase: 88 U/L (ref 38–126)
Total Bilirubin: 0.9 mg/dL (ref 0.3–1.2)
Total Protein: 6.9 g/dL (ref 6.5–8.1)

## 2017-06-09 LAB — I-STAT CHEM 8, ED
BUN: 13 mg/dL (ref 6–20)
Calcium, Ion: 1.22 mmol/L (ref 1.15–1.40)
Chloride: 97 mmol/L — ABNORMAL LOW (ref 101–111)
Creatinine, Ser: 0.8 mg/dL (ref 0.61–1.24)
Glucose, Bld: 177 mg/dL — ABNORMAL HIGH (ref 65–99)
HEMATOCRIT: 46 % (ref 39.0–52.0)
Hemoglobin: 15.6 g/dL (ref 13.0–17.0)
Potassium: 4.5 mmol/L (ref 3.5–5.1)
Sodium: 135 mmol/L (ref 135–145)
TCO2: 26 mmol/L (ref 22–32)

## 2017-06-09 LAB — CBC
HEMATOCRIT: 42.8 % (ref 39.0–52.0)
HEMOGLOBIN: 15.1 g/dL (ref 13.0–17.0)
MCH: 30.8 pg (ref 26.0–34.0)
MCHC: 35.3 g/dL (ref 30.0–36.0)
MCV: 87.2 fL (ref 78.0–100.0)
Platelets: 189 10*3/uL (ref 150–400)
RBC: 4.91 MIL/uL (ref 4.22–5.81)
RDW: 13.5 % (ref 11.5–15.5)
WBC: 8.4 10*3/uL (ref 4.0–10.5)

## 2017-06-09 LAB — LIPASE, BLOOD: LIPASE: 31 U/L (ref 11–51)

## 2017-06-09 LAB — URINALYSIS, ROUTINE W REFLEX MICROSCOPIC
Bilirubin Urine: NEGATIVE
GLUCOSE, UA: NEGATIVE mg/dL
Hgb urine dipstick: NEGATIVE
Ketones, ur: 80 mg/dL — AB
LEUKOCYTES UA: NEGATIVE
NITRITE: NEGATIVE
PH: 8 (ref 5.0–8.0)
Protein, ur: NEGATIVE mg/dL
SPECIFIC GRAVITY, URINE: 1.025 (ref 1.005–1.030)

## 2017-06-09 LAB — CBG MONITORING, ED: Glucose-Capillary: 160 mg/dL — ABNORMAL HIGH (ref 65–99)

## 2017-06-09 LAB — AMMONIA: AMMONIA: 15 umol/L (ref 9–35)

## 2017-06-09 LAB — VITAMIN B12: Vitamin B-12: 352 pg/mL (ref 180–914)

## 2017-06-09 LAB — FOLATE: FOLATE: 9.1 ng/mL (ref >5.9)

## 2017-06-09 MED ORDER — ACETAMINOPHEN 650 MG RE SUPP: 650.0000 mg | Freq: Four times a day (QID) | RECTAL | Status: DC | PRN

## 2017-06-09 MED ORDER — IOPAMIDOL (ISOVUE-300) INJECTION 61%
INTRAVENOUS | Status: AC
  Filled 2017-06-09: qty 100

## 2017-06-09 MED ORDER — SODIUM CHLORIDE 0.9 % IV BOLUS
500.0000 mL | Freq: Once | INTRAVENOUS | Status: AC
  Administered 2017-06-09: 500 mL via INTRAVENOUS

## 2017-06-09 MED ORDER — FAMOTIDINE 20 MG PO TABS
20.0000 mg | ORAL_TABLET | Freq: Every day | ORAL | Status: DC
  Administered 2017-06-10 – 2017-06-15 (×7): 20 mg via ORAL
  Filled 2017-06-09 (×7): qty 1

## 2017-06-09 MED ORDER — AZELASTINE HCL 0.1 % NA SOLN
2.0000 | Freq: Two times a day (BID) | NASAL | Status: DC
  Administered 2017-06-10 – 2017-06-15 (×11): 2 via NASAL
  Filled 2017-06-09: qty 30

## 2017-06-09 MED ORDER — THIAMINE HCL 100 MG/ML IJ SOLN: 100.0000 mg | Freq: Every day | INTRAMUSCULAR | Status: DC

## 2017-06-09 MED ORDER — POLYETHYLENE GLYCOL 3350 17 G PO PACK: 17.0000 g | PACK | Freq: Every day | ORAL | Status: DC | PRN

## 2017-06-09 MED ORDER — SIMVASTATIN 40 MG PO TABS
40.0000 mg | ORAL_TABLET | Freq: Every day | ORAL | Status: DC
  Administered 2017-06-10 – 2017-06-15 (×6): 40 mg via ORAL
  Filled 2017-06-09 (×6): qty 1

## 2017-06-09 MED ORDER — TIMOLOL MALEATE 0.5 % OP SOLN: 1.0000 [drp] | Freq: Two times a day (BID) | OPHTHALMIC | Status: DC

## 2017-06-09 MED ORDER — THIAMINE HCL 100 MG/ML IJ SOLN
500.0000 mg | Freq: Three times a day (TID) | INTRAVENOUS | Status: AC
  Administered 2017-06-10 – 2017-06-12 (×9): 500 mg via INTRAVENOUS
  Filled 2017-06-09 (×9): qty 5

## 2017-06-09 MED ORDER — AMITRIPTYLINE HCL 25 MG PO TABS
25.0000 mg | ORAL_TABLET | Freq: Every day | ORAL | Status: DC
  Administered 2017-06-10 – 2017-06-13 (×5): 25 mg via ORAL
  Administered 2017-06-15: 50 mg via ORAL
  Filled 2017-06-09 (×4): qty 1
  Filled 2017-06-09: qty 2
  Filled 2017-06-09 (×2): qty 1

## 2017-06-09 MED ORDER — HYDRALAZINE HCL 20 MG/ML IJ SOLN: 5.0000 mg | INTRAMUSCULAR | Status: DC | PRN

## 2017-06-09 MED ORDER — ENOXAPARIN SODIUM 40 MG/0.4ML ~~LOC~~ SOLN
40.0000 mg | Freq: Every day | SUBCUTANEOUS | Status: DC
  Administered 2017-06-10 – 2017-06-15 (×6): 40 mg via SUBCUTANEOUS
  Filled 2017-06-09 (×6): qty 0.4

## 2017-06-09 MED ORDER — ALLOPURINOL 100 MG PO TABS
100.0000 mg | ORAL_TABLET | Freq: Two times a day (BID) | ORAL | Status: DC
  Administered 2017-06-10 – 2017-06-15 (×11): 100 mg via ORAL
  Filled 2017-06-09 (×12): qty 1

## 2017-06-09 MED ORDER — SODIUM CHLORIDE 0.9 % IV SOLN
INTRAVENOUS | Status: DC
  Administered 2017-06-10 – 2017-06-13 (×3): via INTRAVENOUS

## 2017-06-09 MED ORDER — DULOXETINE HCL 60 MG PO CPEP
60.0000 mg | ORAL_CAPSULE | Freq: Every day | ORAL | Status: DC
  Administered 2017-06-10 – 2017-06-15 (×6): 60 mg via ORAL
  Filled 2017-06-09 (×6): qty 1

## 2017-06-09 MED ORDER — ZOLPIDEM TARTRATE 5 MG PO TABS
5.0000 mg | ORAL_TABLET | Freq: Every evening | ORAL | Status: DC | PRN
  Filled 2017-06-09: qty 1

## 2017-06-09 MED ORDER — ONDANSETRON HCL 4 MG/2ML IJ SOLN
4.0000 mg | Freq: Once | INTRAMUSCULAR | Status: AC
  Administered 2017-06-09: 4 mg via INTRAVENOUS
  Filled 2017-06-09: qty 2

## 2017-06-09 MED ORDER — ONDANSETRON HCL 4 MG/2ML IJ SOLN: 4.0000 mg | Freq: Three times a day (TID) | INTRAMUSCULAR | Status: DC | PRN

## 2017-06-09 MED ORDER — PINDOLOL 5 MG PO TABS
2.5000 mg | ORAL_TABLET | Freq: Two times a day (BID) | ORAL | Status: DC
  Administered 2017-06-10 – 2017-06-15 (×12): 2.5 mg via ORAL
  Filled 2017-06-09 (×14): qty 1

## 2017-06-09 MED ORDER — LORATADINE 10 MG PO TABS
10.0000 mg | ORAL_TABLET | Freq: Every day | ORAL | Status: DC
Start: 2017-06-09 — End: 2017-06-15
  Administered 2017-06-10 – 2017-06-15 (×7): 10 mg via ORAL
  Filled 2017-06-09 (×7): qty 1

## 2017-06-09 MED ORDER — ACETAMINOPHEN 325 MG PO TABS
650.0000 mg | ORAL_TABLET | Freq: Four times a day (QID) | ORAL | Status: DC | PRN
  Administered 2017-06-10: 650 mg via ORAL
  Filled 2017-06-09: qty 2

## 2017-06-09 MED ORDER — IOPAMIDOL (ISOVUE-300) INJECTION 61%
100.0000 mL | Freq: Once | INTRAVENOUS | Status: AC | PRN
  Administered 2017-06-09: 100 mL via INTRAVENOUS

## 2017-06-09 MED ORDER — LATANOPROST 0.005 % OP SOLN
1.0000 [drp] | Freq: Two times a day (BID) | OPHTHALMIC | Status: DC
Start: 2017-06-09 — End: 2017-06-10
  Filled 2017-06-09: qty 2.5

## 2017-06-09 MED ORDER — PANTOPRAZOLE SODIUM 40 MG PO TBEC
40.0000 mg | DELAYED_RELEASE_TABLET | Freq: Every day | ORAL | Status: DC
  Administered 2017-06-10 – 2017-06-15 (×7): 40 mg via ORAL
  Filled 2017-06-09 (×7): qty 1

## 2017-06-09 MED ORDER — LOSARTAN POTASSIUM 50 MG PO TABS
50.0000 mg | ORAL_TABLET | Freq: Every day | ORAL | Status: DC
  Administered 2017-06-10 – 2017-06-15 (×6): 50 mg via ORAL
  Filled 2017-06-09 (×6): qty 1

## 2017-06-09 NOTE — ED Provider Notes (Signed)
[facility] EMERGENCY DEPARTMENT Provider Note   CSN: [id number] Arrival date & time: 06/09/17  1053     History   Chief Complaint Chief Complaint  Patient presents with  . Nausea  . Emesis  . Weakness    HPI [name] is a 67 y.o. male.  HPI  67 year old man history of hypertension presents today with reports that he has diplopia with a left eye palsy, worsening nausea and vomiting with weight loss, and increased confusion since an endoscopy last week.  Most of the history is from his wife.  She states that he began having double vision May 1.  He was seen and evaluated and then ophthalmology office.  He was rechecked the next week and there was no improvement seen.  He was sent to the ED and had an MRI performed that showed no evidence of stroke or intracranial explanation.  He was also complaining of poor appetite, nausea, and vomiting, and has had a weight loss of approximately 30 pounds.  He was sent to see [name] on May 10 with endoscopy scheduled for May 11.  Patient and wife state baseline health until several weeks ago was active although has some neuropathy which affects his balance and macular degeneration.   On May 11 he reports an endoscopy was attempted.  It is reported that they were unable to complete the endoscopy due to food being present in his stomach.  Wife reports that since the endoscopy has had increased confusion.  He has continued to be on a liquid diet.  She states he vomited this morning and there was some fecal odor to this.  No fever, head injury, chest pain, dyspnea, abdominal pain, or diarrhea, fever, or chills is reported  Past Medical History:  Diagnosis Date  . Anemia, unspecified   . BPH (benign prostatic hyperplasia)   . Bradycardia   . Celiac disease   . Chronic sinusitis   . Depression   . Dizziness and giddiness   . Elevated PSA measurement   . Enlarged prostate   . Glaucoma   . Gout   . Hernia of abdominal  cavity   . Hypercholesteremia   . Hyperkalemia   . Hyperlipemia   . Hypertension   . Lactose intolerance   . Major depressive disorder   . Malignant neoplasm of prostate ([place])   . Neuropathy   . Osteoarthritis of right knee   . Peptic ulcer   . Polyneuropathy   . Postgastric surgery syndromes   . Tachycardia   . Tachycardia     Patient Active Problem List   Diagnosis Date Noted  . Discoloration of skin of toe 03/18/2016  . Tachycardia 03/02/2015  . Bradycardia 08/03/2014  . Palpitations 06/26/2014  . Benign essential HTN 06/26/2014  . Fatigue 06/26/2014    Past Surgical History:  Procedure Laterality Date  . HERNIA REPAIR     x 5  . MASTECTOMY Right   . SHOULDER SURGERY Right   . SMALL BOWEL REPAIR     for ulcer         Home Medications    Prior to Admission medications   Medication Sig Start Date End Date Taking? Authorizing Provider  allopurinol (ZYLOPRIM) 100 MG tablet Take 100 mg by mouth 2 (two) times daily.     [provider]  amitriptyline (ELAVIL) 25 MG tablet  03/06/16   [provider]  azelastine (ASTELIN) 0.1 % nasal spray  01/04/16   [provider]  DULoxetine (CYMBALTA) 60 MG capsule Take 60 mg by mouth daily.    [provider]  loratadine (CLARITIN) 10 MG tablet Take 10 mg by mouth daily.    [provider]  losartan (COZAAR) 50 MG tablet Take by mouth. 11/14/15   [provider]  pantoprazole (PROTONIX) 40 MG tablet Take 40 mg by mouth daily.    [provider]  pindolol (VISKEN) 5 MG tablet Take 0.5 tablets (2.5 mg total) by mouth 2 (two) times daily. 03/02/15   Sueanne Margarita, MD  simvastatin (ZOCOR) 40 MG tablet Take 40 mg by mouth daily.    [provider]  timolol (TIMOPTIC) 0.5 % ophthalmic solution 1 drop 2 (two) times daily.    [provider]    Family History Family History  Problem Relation Age of Onset  . Alzheimer's disease Mother   . Dementia  Mother   . Pulmonary disease Mother   . Heart disease Father   . Arrhythmia Father        pacemaker  . Prostate cancer Father   . Healthy Sister   . Healthy Brother   . Healthy Daughter     Social History Social History   Tobacco Use  . Smoking status: Former Smoker    Last attempt to quit: 02/25/1980    Years since quitting: 37.3  . Smokeless tobacco: Never Used  Substance Use Topics  . Alcohol use: No    Alcohol/week: 0.0 oz  . Drug use: No     Allergies   Gabapentin; Gluten meal; and Lactose   Review of Systems Review of Systems  All other systems reviewed and are negative.    Physical Exam Updated Vital Signs BP (!) 135/101 (BP Location: Right Arm)   Pulse 86   Temp 98.6 F (37 C) (Oral)   Resp 18   SpO2 100%   Physical Exam  Constitutional: He is oriented to person, place, and time. He appears well-developed and well-nourished.  HENT:  Head: Normocephalic and atraumatic.  Right Ear: External ear normal.  Left Ear: External ear normal.  Eyes:  Decreased abduction left eye with nystagmus noted on exam  Neck: Normal range of motion. Neck supple.  Cardiovascular: Normal rate, regular rhythm and normal heart sounds.  Pulmonary/Chest: Effort normal and breath sounds normal.  Abdominal: Soft. Bowel sounds are normal.  Musculoskeletal: Normal range of motion.  Neurological: He is alert and oriented to person, place, and time. He displays normal reflexes. A cranial nerve deficit is present. He exhibits normal muscle tone. Coordination normal.  Skin: Skin is warm and dry. Capillary refill takes less than 2 seconds.  Psychiatric: He has a normal mood and affect.  Nursing note and vitals reviewed.    ED Treatments / Results  Labs (all labs ordered are listed, but only abnormal results are displayed) Labs Reviewed  BASIC METABOLIC PANEL - Abnormal; Notable for the following components:      Result Value   Sodium 134 (*)    Chloride 99 (*)    Glucose, Bld  177 (*)    All other components within normal limits  URINALYSIS, ROUTINE W REFLEX MICROSCOPIC - Abnormal; Notable for the following components:   Ketones, ur 80 (*)    All other components within normal limits  CBG MONITORING, ED - Abnormal; Notable for the following components:   Glucose-Capillary 160 (*)    All other components within normal limits  CBC  LIPASE, BLOOD  HEPATIC FUNCTION PANEL  Labs reviewed EKG EKG Interpretation  Date/Time:  Tuesday June 09 2017 12:11:06 EDT Ventricular Rate:  75 PR Interval:  170 QRS Duration: 82 QT Interval:  376 QTC Calculation: 419 R Axis:   33 Text Interpretation:  Normal sinus rhythm Normal ECG Since last tracing rate faster Confirmed by Dorie Rank [phone]) on 06/09/2017 3:15:21 PM   Radiology Dg Chest 1 View  Result Date: 06/09/2017 CLINICAL DATA:  Dizziness and nausea. EXAM: CHEST  1 VIEW COMPARISON:  Chest x-Skylinn Vialpando dated March 16, 2007. FINDINGS: The heart size and mediastinal contours are within normal limits. Normal pulmonary vascularity. No focal consolidation, pleural effusion, or pneumothorax. No acute osseous abnormality. Interval right shoulder arthroplasty, partially visualized. IMPRESSION: No active disease. Electronically Signed   By: Titus Dubin M.D.   On: 06/09/2017 17:25   Dg Abd 2 Views  Result Date: 06/09/2017 CLINICAL DATA:  Vomiting EXAM: ABDOMEN - 2 VIEW COMPARISON:  None. FINDINGS: Supine and upright images obtained. There is moderate stool in the colon. There is no evident bowel dilatation or air-fluid level to suggest bowel obstruction. No free air. There is a surgical clip in the medial left upper quadrant. Visualized lung bases are clear. There are is a phlebolith in the lower left pelvis. IMPRESSION: Moderate stool in colon.  No evident bowel obstruction or free air. Electronically Signed   By: Lowella Grip III M.D.   On: 06/09/2017 19:10   Radiology studies reviewed Procedures Procedures (including  critical care time)  Medications Ordered in ED Medications - No data to display   Initial Impression / Assessment and Plan / ED Course  I have reviewed the triage vital signs and the nursing notes.  Pertinent labs & imaging results that were available during my care of the patient were reviewed by me and considered in my medical decision making (see chart for details).   1- diplopia with bilateral abducens palsy and horizontal nystagmus 2- nausea and vomiting-abdomen soft and no ttp, plain films without obstruction 3- generalized weakness-repeat mri ordered in consultation with neuro pending 4- confusion  Broad dDx considered with stroke, bleeding, infection, metabolic abnormalities considered.  Currently suspicious that poor diet and weight loss as initiating process with subsequent secondary problems.   Consultation with Dr. Leonel Ramsay and he has high suspicion for folate deficiency and has ordered folate level and treatment  Discussed with Dr. Blaine Hamper and he will see Final Clinical Impressions(s) / ED Diagnoses   Final diagnoses:  Weakness  Diplopia  Nausea and vomiting, intractability of vomiting not specified, unspecified vomiting type    ED Discharge Orders    None       Pattricia Boss, MD 06/09/17 2046

## 2017-06-09 NOTE — ED Notes (Signed)
ED Provider at bedside. 

## 2017-06-09 NOTE — H&P (Addendum)
History and Physical    OSCAR HANK ZOX:[id number] DOB: [birth year] DOA: 06/09/2017  Referring MD/NP/PA:   PCP: [name], MD   Patient coming from:  The patient is coming from home.  At baseline, pt is independent for most of ADL.   Chief Complaint: double vision, poor balance, nausea, vomiting  HPI: [name] is a 67 y.o. male with medical history significant of hypertension, hyperlipidemia, GERD, gout, depression, peptic ulcer disease, BPH, prostate cancer (surgery, no radiation or chemotherapy), celiac disease, who presents with double vision, poor balance, nausea, vomiting.  Pt states that he has been having double vision in both eyes for more than 2 weeks. He also has decreased memory, dizziness, poor balance recently.Patient states that he has a chronic peripheral neuropathy in both feet, but no unilateral weakness in extremities. No facial droop, slurred speech. He has mild bilateral hearing loss recently. Pt was seen by opthalmologist, then seen here for MRI. MRI on 4/9 was negative.   Patient also reports nausea and vomiting, which has been going on for more than one week. He vomited 5 times today, no blood in the vomitus. He has poor appetite and decreased oral intake. No abdominal pain. His last bowel movement was yesterday. He states that he is not constipated. He was seen by Dr. [name] of GI on Wed last week and had endoscopy on Thursday of last week, but could not complete procedure due to too much food in pts stomach. Patient does not have chest pain, shortness breath, cough, fever or chills. No symptoms of UTI. Pt was seen by his PCP today and sent to ED for further evaluation and treatment.  ED Course: pt was found to have WBC 8.4, lipase is 31, normal liver function, ammonia 15, electrolytes renal function okay, temperature normal, no tachycardia, no tachypnea, oxygen saturation 100% on room air, negative chest x-ray. X-ray of abdomen is negative except for  moderate amount of stool in colon. Patient is placed on telemetry bed for observation.  Review of Systems:   General: no fevers, chills, no body weight gain, has poor appetite, has fatigue HEENT: no blurry vision, hearing changes or sore throat Respiratory: no dyspnea, coughing, wheezing CV: no chest pain, no palpitations GI: has nausea, vomiting, no abdominal pain, diarrhea, constipation GU: no dysuria, burning on urination, increased urinary frequency, hematuria  Ext: no leg edema Neuro: no unilateral weakness. Has double vision, poor memory, dizziness, poor balance. Skin: no rash, no skin tear. MSK: No muscle spasm, no deformity, no limitation of range of movement in spin Heme: No easy bruising.  Travel history: No recent long distant travel.  Allergy:  Allergies  Allergen Reactions  . Gabapentin Other (See Comments)    Exacerbated neuropathy symptoms  . Gluten Meal Diarrhea    Gas, bloating  . Lactose Diarrhea    Past Medical History:  Diagnosis Date  . Anemia, unspecified   . BPH (benign prostatic hyperplasia)   . Bradycardia   . Celiac disease   . Chronic sinusitis   . Depression   . Dizziness and giddiness   . Elevated PSA measurement   . Enlarged prostate   . Glaucoma   . Gout   . Hernia of abdominal cavity   . Hypercholesteremia   . Hyperkalemia   . Hyperlipemia   . Hypertension   . Lactose intolerance   . Major depressive disorder   . Malignant neoplasm of prostate ([place])   . Neuropathy   . Osteoarthritis of right  knee   . Peptic ulcer   . Polyneuropathy   . Postgastric surgery syndromes   . Tachycardia   . Tachycardia     Past Surgical History:  Procedure Laterality Date  . HERNIA REPAIR     x 5  . MASTECTOMY Right   . SHOULDER SURGERY Right   . SMALL BOWEL REPAIR     for ulcer     Social History:  reports that he quit smoking about 37 years ago. He has never used smokeless tobacco. He reports that he does not drink alcohol or use  drugs.  Family History:  Family History  Problem Relation Age of Onset  . Alzheimer's disease Mother   . Dementia Mother   . Pulmonary disease Mother   . Heart disease Father   . Arrhythmia Father        pacemaker  . Prostate cancer Father   . Healthy Sister   . Healthy Brother   . Healthy Daughter      Prior to Admission medications   Medication Sig Start Date End Date Taking? Authorizing Provider  allopurinol (ZYLOPRIM) 100 MG tablet Take 100 mg by mouth 2 (two) times daily.    Yes [provider]  amitriptyline (ELAVIL) 25 MG tablet Take 25-50 mg by mouth at bedtime.  03/06/16  Yes [provider]  azelastine (ASTELIN) 0.1 % nasal spray Place 2 sprays into both nostrils 2 (two) times daily.  01/04/16  Yes [provider]  DULoxetine (CYMBALTA) 60 MG capsule Take 60 mg by mouth daily.   Yes [provider]  loratadine (CLARITIN) 10 MG tablet Take 10 mg by mouth daily.   Yes [provider]  losartan (COZAAR) 50 MG tablet Take 50 mg by mouth daily.  11/14/15  Yes [provider]  omeprazole (PRILOSEC) 40 MG capsule Take 40 mg by mouth 2 (two) times daily. 05/27/17  Yes [provider]  pantoprazole (PROTONIX) 40 MG tablet Take 40 mg by mouth daily.   Yes [provider]  pindolol (VISKEN) 5 MG tablet Take 0.5 tablets (2.5 mg total) by mouth 2 (two) times daily. 03/02/15  Yes Turner, Eber Hong, MD  ranitidine (ZANTAC) 150 MG capsule Take 150 mg by mouth daily.   Yes [provider]  simvastatin (ZOCOR) 40 MG tablet Take 40 mg by mouth daily.   Yes [provider]  timolol (TIMOPTIC) 0.5 % ophthalmic solution Place 1 drop into the left eye 2 (two) times daily.    Yes [provider]  latanoprost (XALATAN) 0.005 % ophthalmic solution Place 1 drop into the left eye 2 (two) times daily.    [provider]    Physical Exam: Vitals:   06/09/17 2240 06/09/17 2300 06/10/17 0200 06/10/17  0411  BP:  (!) 151/90 (!) 105/55 (!) 93/56  Pulse:   77 68  Resp:   17 18  Temp: 98.2 F (36.8 C)  98.1 F (36.7 C) 97.9 F (36.6 C)  TempSrc:    Oral  SpO2:   95% 94%   General: Not in acute distress HEENT:       Eyes: PERRL, EOMI, no scleral icterus.       ENT: No discharge from the ears and nose, no pharynx injection, no tonsillar enlargement.        Neck: No JVD, no bruit, no mass felt. Heme: No neck lymph node enlargement. Cardiac: S1/S2, RRR, No murmurs, No gallops or rubs. Respiratory: No rales, wheezing, rhonchi or rubs.  GI: Soft, nondistended, nontender, no rebound pain, no organomegaly, BS present. GU: No hematuria Ext: No pitting leg edema bilaterally. 2+DP/PT pulse bilaterally. Musculoskeletal: No joint deformities, No joint redness or warmth, no limitation of ROM in spin. Skin: No rashes.  Neuro: Alert, oriented X3, cranial nerves II-XII grossly intact except for diplopia bilateraly, Muscle strength 5/5 in all extremities, sensation to light touch intact. Brachial reflex 2+ bilaterally. Knee reflex 1+ bilaterally. Negative Babinski's sign.  Psych: Patient is not psychotic, no suicidal or hemocidal ideation.  Labs on Admission: I have personally reviewed following labs and imaging studies  CBC: Recent Labs  Lab 06/09/17 1225 06/09/17 1644  WBC 8.4  --   HGB 15.1 15.6  HCT 42.8 46.0  MCV 87.2  --   PLT 189  --    Basic Metabolic Panel: Recent Labs  Lab 06/09/17 1225 06/09/17 1644  NA 134* 135  K 4.3 4.5  CL 99* 97*  CO2 24  --   GLUCOSE 177* 177*  BUN 10 13  CREATININE 1.14 0.80  CALCIUM 9.9  --    GFR: CrCl cannot be calculated (Unknown ideal weight.). Liver Function Tests: Recent Labs  Lab 06/09/17 1225  AST 20  ALT 15*  ALKPHOS 88  BILITOT 0.9  PROT 6.9  ALBUMIN 4.2   Recent Labs  Lab 06/09/17 1225  LIPASE 31   Recent Labs  Lab 06/09/17 1707  AMMONIA 15   Coagulation Profile: No results for input(s): INR, PROTIME in the last  168 hours. Cardiac Enzymes: No results for input(s): CKTOTAL, CKMB, CKMBINDEX, TROPONINI in the last 168 hours. BNP (last 3 results) No results for input(s): PROBNP in the last 8760 hours. HbA1C: No results for input(s): HGBA1C in the last 72 hours. CBG: Recent Labs  Lab 06/09/17 1247  GLUCAP 160*   Lipid Profile: No results for input(s): CHOL, HDL, LDLCALC, TRIG, CHOLHDL, LDLDIRECT in the last 72 hours. Thyroid Function Tests: No results for input(s): TSH, T4TOTAL, FREET4, T3FREE, THYROIDAB in the last 72 hours. Anemia Panel: Recent Labs    06/09/17 2050  VITAMINB12 352  FOLATE 9.1   Urine analysis:    Component Value Date/Time   COLORURINE YELLOW 06/09/2017 [place] 06/09/2017 1413   LABSPEC 1.025 06/09/2017 1413   PHURINE 8.0 06/09/2017 1413   GLUCOSEU NEGATIVE 06/09/2017 1413   HGBUR NEGATIVE 06/09/2017 1413   BILIRUBINUR NEGATIVE 06/09/2017 1413   KETONESUR 80 (A) 06/09/2017 1413   PROTEINUR NEGATIVE 06/09/2017 1413   UROBILINOGEN 0.2 04/19/2007 1436   NITRITE NEGATIVE 06/09/2017 1413   LEUKOCYTESUR NEGATIVE 06/09/2017 1413   Sepsis Labs: @LABRCNTIP (procalcitonin:4,lacticidven:4) )No results found for this or any previous visit (from the past 240 hour(s)).   Radiological Exams on Admission: Dg Chest 1 View  Result Date: 06/09/2017 CLINICAL DATA:  Dizziness and nausea. EXAM: CHEST  1 VIEW COMPARISON:  Chest x-ray dated March 16, 2007. FINDINGS: The heart size and mediastinal contours are within normal limits. Normal pulmonary vascularity. No focal consolidation, pleural effusion, or pneumothorax. No acute osseous abnormality. Interval right shoulder arthroplasty, partially visualized. IMPRESSION: No active disease. Electronically Signed   By: Titus Dubin M.D.   On: 06/09/2017 17:25   Mr Brain Wo Contrast  Result Date: 06/09/2017 CLINICAL DATA:  Diplopia and left eye ophthalmoplegia. EXAM: MRI HEAD WITHOUT CONTRAST TECHNIQUE: Multiplanar,  multiecho pulse sequences of the brain and surrounding structures were obtained without intravenous contrast. COMPARISON:  06/02/2017 brain MRI FINDINGS: BRAIN: The midline structures are normal. There is  no acute infarct or acute hemorrhage. No mass lesion, hydrocephalus, dural abnormality or extra-axial collection. Minimal white matter hyperintensity, nonspecific and commonly seen in asymptomatic patients of this age. Focus of hyperintense T2-weighted signal in the right parietal white matter is favored to be a prominent perivascular space. No age-advanced or lobar predominant atrophy. No chronic microhemorrhage or superficial siderosis. VASCULAR: Major intracranial arterial and venous sinus flow voids are preserved. SKULL AND UPPER CERVICAL SPINE: The visualized skull base, calvarium, upper cervical spine and extracranial soft tissues are normal. SINUSES/ORBITS: No fluid levels or advanced mucosal thickening. No mastoid or middle ear effusion. Normal orbits. IMPRESSION: No acute intracranial abnormality or causative finding of diplopia. Electronically Signed   By: Ulyses Jarred M.D.   On: 06/09/2017 22:48   Ct Abdomen Pelvis W Contrast  Result Date: 06/09/2017 CLINICAL DATA:  Generalized weakness. Nausea and vomiting for 2 weeks EXAM: CT ABDOMEN AND PELVIS WITH CONTRAST TECHNIQUE: Multidetector CT imaging of the abdomen and pelvis was performed using the standard protocol following bolus administration of intravenous contrast. CONTRAST:  175mL ISOVUE-300 IOPAMIDOL (ISOVUE-300) INJECTION 61% COMPARISON:  None. FINDINGS: Lower chest: Lung bases are clear. Hepatobiliary: No focal hepatic lesion. No biliary duct dilatation. Gallbladder is normal. Common bile duct is normal. Pancreas: Pancreas is normal. No ductal dilatation. No pancreatic inflammation. Spleen: Normal spleen Adrenals/urinary tract: Adrenal glands and kidneys are normal. The ureters and bladder normal. Stomach/Bowel: Stomach is normal. There is a  gastroenteric anastomosis along the gastric antral region anteriorly. Duodenum and small-bowel are normal without evidence of inflammation or obstruction. There is 2 adjacent midline ventral hernias which contain loops of nonobstructed small bowel. Distal small bowel leading up the terminal ileum is normal. Appendix normal. Ascending, transverse, and descending colon are normal. Vascular/Lymphatic: Abdominal aorta is normal caliber with atherosclerotic calcification. There is no retroperitoneal or periportal lymphadenopathy. No pelvic lymphadenopathy. Reproductive: Post prostatectomy Other: No free fluid.  LEFT inguinal hernia repair. Musculoskeletal: No aggressive osseous lesion. IMPRESSION: 1. Two midline ventral hernias contain loops of nonobstructed small bowel. 2. Postsurgical change in the gastric antrum with gastroenteric anastomosis. No complication. 3. No acute abdominopelvic findings. Electronically Signed   By: Suzy Bouchard M.D.   On: 06/09/2017 22:19   Dg Abd 2 Views  Result Date: 06/09/2017 CLINICAL DATA:  Vomiting EXAM: ABDOMEN - 2 VIEW COMPARISON:  None. FINDINGS: Supine and upright images obtained. There is moderate stool in the colon. There is no evident bowel dilatation or air-fluid level to suggest bowel obstruction. No free air. There is a surgical clip in the medial left upper quadrant. Visualized lung bases are clear. There are is a phlebolith in the lower left pelvis. IMPRESSION: Moderate stool in colon.  No evident bowel obstruction or free air. Electronically Signed   By: Lowella Grip III M.D.   On: 06/09/2017 19:10     EKG: Independently reviewed.  Sinus rhythm, QTC 419, low voltage, nonspecific T-wave change.  Assessment/Plan Principal Problem:   Double vision Active Problems:   Benign essential HTN   Gout   Depression   Nausea & vomiting   GERD (gastroesophageal reflux disease)   HLD (hyperlipidemia)   Double vision: Etiology is not clear. Patient had a  negative MRI of the brain on 4/9. neurology was consulted. Dr. Leonel Ramsay to suspect patient may have thiamine deficiency secondary to nausea vomiting and poor absorption.  -will place on tele bed for obs -highly appreciate Dr. Cecil Cobbs consultation, will follow-up recommendations. -IV Vb1 500 mg tid -check Vb12, Vb1  and folate level -repeat MRI of brain  HTN:  -Continue home medications: Losartan, pindolol -IV hydralazine prn  Gout: Probably idiopathic, involving both hands, chronic, no tophus. -continue home allopurinol  Depression: Stable, no suicidal or homicidal ideations. -Continue home medications: Cymbalta, amitriptyline  Nausea & vomiting: Etiology is not clear. Lipase normal. Abdominal x-ray did not show small bowel obstruction, which does not rule out possibility of early stage of small bowel obstruction. -Will get CT abdomen/pelvis -When necessary Zofran for nausea and vomiting  GERD: -Protonix and Pepcid  HLD: -zocor  DVT ppx: SQ Lovenox Code Status: Full code Family Communication:   Yes, patient's wife at bed side Disposition Plan:  Anticipate discharge back to previous home environment Consults called:  Neuro, Dr. Leonel Ramsay Admission status: Obs / tele     Date of Service 06/10/2017    Ivor Costa [facility] Pager [phone]  If 7PM-7AM, please contact night-coverage [email or web address] Password [email or web address] 06/10/2017, 5:02 AM

## 2017-06-09 NOTE — Consult Note (Addendum)
Neurology Consultation Reason for Consult: unsteady gait.   Referring Physician: Jeanell Sparrow, D  CC: Cognitive difficulty  History is obtained from: Patient, wife  HPI: [name] is a 67 y.o. male with a history of celiac disease who has been having eating intolerance for a couple of months.  This is been worsening and he has had significant nausea and vomiting over the past few days.  Since early March, he has lost approximately 30 pounds.  Over the past 2 weeks, he has had double vision on lateral gaze, he sought care at an ophthalmologist who diagnosed abducens palsy and referred him to the ER for an MRI brain.  This was performed on 4/9 and was negative.  He was discharged home.  Since that time, he has been having increasing difficulty with walking and has had cognitive difficulty since last Thursday.  This does correspond to a time when he had some sedating medications for endoscopy, which was unable to be performed due to gastric contents.  ROS: A 14 point ROS was performed and is negative except as noted in the HPI.   Past Medical History:  Diagnosis Date  . Anemia, unspecified   . BPH (benign prostatic hyperplasia)   . Bradycardia   . Celiac disease   . Chronic sinusitis   . Depression   . Dizziness and giddiness   . Elevated PSA measurement   . Enlarged prostate   . Glaucoma   . Gout   . Hernia of abdominal cavity   . Hypercholesteremia   . Hyperkalemia   . Hyperlipemia   . Hypertension   . Lactose intolerance   . Major depressive disorder   . Malignant neoplasm of prostate ([place])   . Neuropathy   . Osteoarthritis of right knee   . Peptic ulcer   . Polyneuropathy   . Postgastric surgery syndromes   . Tachycardia   . Tachycardia      Family History  Problem Relation Age of Onset  . Alzheimer's disease Mother   . Dementia Mother   . Pulmonary disease Mother   . Heart disease Father   . Arrhythmia Father        pacemaker  . Prostate cancer Father   . Healthy  Sister   . Healthy Brother   . Healthy Daughter      Social History:  reports that he quit smoking about 37 years ago. He has never used smokeless tobacco. He reports that he does not drink alcohol or use drugs.   Exam: Current vital signs: BP 138/89   Pulse 85   Temp 98.6 F (37 C) (Oral)   Resp 11   SpO2 100%  Vital signs in last 24 hours: Temp:  [98 F (36.7 C)-98.6 F (37 C)] 98.6 F (37 C) (04/16 1430) Pulse Rate:  [73-86] 85 (04/16 1600) Resp:  [11-18] 11 (04/16 1600) BP: (114-138)/(72-101) 138/89 (04/16 1600) SpO2:  [99 %-100 %] 100 % (04/16 1600)   Physical Exam  Constitutional: Appears well-developed and well-nourished.  Psych: Affect appropriate to situation Eyes: No scleral injection HENT: No OP obstrucion Head: Normocephalic.  Cardiovascular: Normal rate and regular rhythm.  Respiratory: Effort normal, non-labored breathing GI: Soft.  No distension. There is no tenderness.  Skin: WDI  Neuro: Mental Status: Patient is awake, alert, oriented to person, place, month, year, and situation. Patient is able to give a clear and coherent history. No signs of aphasia or neglect Cranial Nerves: II: Visual Fields are full. Pupils are equal,  round, and reactive to light.   III,IV, VI: He has market nystagmus on rightward gaze with bilateral partial abducens palsies. V: Facial sensation is symmetric to temperature VII: Facial movement is symmetric.  VIII: hearing is intact to voice X: Uvula elevates symmetrically XI: Shoulder shrug is symmetric. XII: tongue is midline without atrophy or fasciculations.  Motor: Tone is normal. Bulk is normal. 5/5 strength was present in all four extremities.  Sensory: Sensation is symmetric to light touch and temperature in the arms and legs. Deep Tendon Reflexes: 2+ and symmetric in the biceps and patellae.  Cerebellar: FNF with bilateral intentional tremor and HKS are intact bilaterally Gait: He has a markedly ataxic gait  and is very wobbly, but does not fall or take a step with Romberg maneuver.   I have reviewed labs in epic and the results pertinent to this consultation are: CBG 160 Ammonia-normal  I have reviewed the images obtained: MRI brain from a few days ago-negative  Impression: 67 year old male with a history of celiac disease presenting with bilateral partial abducens palsies, gait ataxia, cognitive deficits (mild encephalopathy).  This triad of symptoms in someone who has had marked weight loss over the past 6 weeks is highly concerning for possible Wernicke's encephalopathy.  Other alternatives such as autoimmune causes are possible given his history of autoimmune disease, but my suspicion is rather for dietary insufficiency.  Recommendations: 1) check vitamin B1, B12, folate 2) high-dose IV thiamine, 500 mg 3 times daily for 3 days 3) physical therapy evaluation, occupational therapy evaluation 4) repeat MRI brain 5) neurology will continue to follow   Roland Rack, MD [facility] [phone]  If 7pm- 7am, please page neurology on call as listed in [place].

## 2017-06-09 NOTE — ED Notes (Signed)
Neurology MD at bedside

## 2017-06-09 NOTE — ED Notes (Signed)
Patient is stable and ready to be transport to the floor at this time.  Report was called to 3W RN.  Belongings taken with the patient to the floor.   

## 2017-06-09 NOTE — ED Triage Notes (Addendum)
Pt has been having double vision for two weeks and was seen by opthalmologist, then seen here for MRI. MRI did not show anything. Pt then developed N/V. Pt went to GI MD on Wed last week and had endoscopy on Thursday of last week. Could not complete procedure due to too much food in pts stomach. Pt has been having memory problems immediately following procedure last Thurday- pt had anesthesia . Pt has been dizzy, nauseated. Pt has poor balance due to neuropathy, but feels it is worse. Pt continues to have N/V and double vision. Pt's wife reports his vomit today seems like it has pieces of stool in it. Pt hasn't had a BM in a few days. Pt went to his MD today who was concerned that he appears jaundiced, dehydrated.

## 2017-06-10 ENCOUNTER — Other Ambulatory Visit: Payer: Self-pay

## 2017-06-10 ENCOUNTER — Encounter (HOSPITAL_COMMUNITY): Payer: Self-pay | Admitting: *Deleted

## 2017-06-10 DIAGNOSIS — H532 Diplopia: Secondary | ICD-10-CM

## 2017-06-10 DIAGNOSIS — H5509 Other forms of nystagmus: Secondary | ICD-10-CM | POA: Diagnosis not present

## 2017-06-10 DIAGNOSIS — R634 Abnormal weight loss: Secondary | ICD-10-CM | POA: Diagnosis present

## 2017-06-10 DIAGNOSIS — K2210 Ulcer of esophagus without bleeding: Secondary | ICD-10-CM | POA: Diagnosis not present

## 2017-06-10 DIAGNOSIS — M109 Gout, unspecified: Secondary | ICD-10-CM | POA: Diagnosis not present

## 2017-06-10 DIAGNOSIS — Z6835 Body mass index (BMI) 35.0-35.9, adult: Secondary | ICD-10-CM | POA: Diagnosis not present

## 2017-06-10 DIAGNOSIS — G629 Polyneuropathy, unspecified: Secondary | ICD-10-CM

## 2017-06-10 DIAGNOSIS — H9193 Unspecified hearing loss, bilateral: Secondary | ICD-10-CM | POA: Diagnosis not present

## 2017-06-10 DIAGNOSIS — R2681 Unsteadiness on feet: Secondary | ICD-10-CM | POA: Diagnosis not present

## 2017-06-10 DIAGNOSIS — E785 Hyperlipidemia, unspecified: Secondary | ICD-10-CM | POA: Diagnosis not present

## 2017-06-10 DIAGNOSIS — R269 Unspecified abnormalities of gait and mobility: Secondary | ICD-10-CM | POA: Diagnosis not present

## 2017-06-10 DIAGNOSIS — K219 Gastro-esophageal reflux disease without esophagitis: Secondary | ICD-10-CM | POA: Diagnosis present

## 2017-06-10 DIAGNOSIS — E512 Wernicke's encephalopathy: Secondary | ICD-10-CM | POA: Diagnosis not present

## 2017-06-10 DIAGNOSIS — M1711 Unilateral primary osteoarthritis, right knee: Secondary | ICD-10-CM | POA: Diagnosis present

## 2017-06-10 DIAGNOSIS — F329 Major depressive disorder, single episode, unspecified: Secondary | ICD-10-CM | POA: Diagnosis not present

## 2017-06-10 DIAGNOSIS — H4923 Sixth [abducent] nerve palsy, bilateral: Secondary | ICD-10-CM | POA: Diagnosis not present

## 2017-06-10 DIAGNOSIS — G4733 Obstructive sleep apnea (adult) (pediatric): Secondary | ICD-10-CM | POA: Diagnosis present

## 2017-06-10 DIAGNOSIS — M1A0420 Idiopathic chronic gout, left hand, without tophus (tophi): Secondary | ICD-10-CM | POA: Diagnosis present

## 2017-06-10 DIAGNOSIS — H3530 Unspecified macular degeneration: Secondary | ICD-10-CM | POA: Diagnosis not present

## 2017-06-10 DIAGNOSIS — R112 Nausea with vomiting, unspecified: Secondary | ICD-10-CM | POA: Diagnosis not present

## 2017-06-10 DIAGNOSIS — K449 Diaphragmatic hernia without obstruction or gangrene: Secondary | ICD-10-CM | POA: Diagnosis present

## 2017-06-10 DIAGNOSIS — H409 Unspecified glaucoma: Secondary | ICD-10-CM | POA: Diagnosis not present

## 2017-06-10 DIAGNOSIS — Z741 Need for assistance with personal care: Secondary | ICD-10-CM | POA: Diagnosis not present

## 2017-06-10 DIAGNOSIS — M1A0410 Idiopathic chronic gout, right hand, without tophus (tophi): Secondary | ICD-10-CM | POA: Diagnosis present

## 2017-06-10 DIAGNOSIS — I10 Essential (primary) hypertension: Secondary | ICD-10-CM | POA: Diagnosis not present

## 2017-06-10 DIAGNOSIS — E669 Obesity, unspecified: Secondary | ICD-10-CM | POA: Diagnosis present

## 2017-06-10 DIAGNOSIS — I471 Supraventricular tachycardia: Secondary | ICD-10-CM | POA: Diagnosis not present

## 2017-06-10 DIAGNOSIS — K900 Celiac disease: Secondary | ICD-10-CM | POA: Diagnosis present

## 2017-06-10 DIAGNOSIS — Z8711 Personal history of peptic ulcer disease: Secondary | ICD-10-CM | POA: Diagnosis not present

## 2017-06-10 LAB — CBC
HCT: 37.1 % — ABNORMAL LOW (ref 39.0–52.0)
HEMOGLOBIN: 12.9 g/dL — AB (ref 13.0–17.0)
MCH: 30.1 pg (ref 26.0–34.0)
MCHC: 34.8 g/dL (ref 30.0–36.0)
MCV: 86.7 fL (ref 78.0–100.0)
Platelets: 169 10*3/uL (ref 150–400)
RBC: 4.28 MIL/uL (ref 4.22–5.81)
RDW: 13.5 % (ref 11.5–15.5)
WBC: 8.7 10*3/uL (ref 4.0–10.5)

## 2017-06-10 LAB — BASIC METABOLIC PANEL
ANION GAP: 10 (ref 5–15)
BUN: 14 mg/dL (ref 6–20)
CO2: 22 mmol/L (ref 22–32)
Calcium: 9 mg/dL (ref 8.9–10.3)
Chloride: 103 mmol/L (ref 101–111)
Creatinine, Ser: 0.94 mg/dL (ref 0.61–1.24)
GFR calc Af Amer: >60 mL/min (ref >60)
GLUCOSE: 121 mg/dL — AB (ref 65–99)
POTASSIUM: 3.7 mmol/L (ref 3.5–5.1)
SODIUM: 135 mmol/L (ref 135–145)

## 2017-06-10 LAB — GLUCOSE, CAPILLARY: GLUCOSE-CAPILLARY: 100 mg/dL — AB (ref 65–99)

## 2017-06-10 MED ORDER — TIMOLOL MALEATE 0.5 % OP SOLN
1.0000 [drp] | Freq: Two times a day (BID) | OPHTHALMIC | Status: DC
Start: 2017-06-10 — End: 2017-06-15
  Administered 2017-06-10 – 2017-06-15 (×11): 1 [drp] via OPHTHALMIC
  Filled 2017-06-10 (×3): qty 5

## 2017-06-10 MED ORDER — LATANOPROST 0.005 % OP SOLN
1.0000 [drp] | Freq: Two times a day (BID) | OPHTHALMIC | Status: DC
Start: 2017-06-10 — End: 2017-06-15
  Administered 2017-06-10 – 2017-06-15 (×11): 1 [drp] via OPHTHALMIC
  Filled 2017-06-10 (×2): qty 2.5

## 2017-06-10 MED ORDER — BOOST / RESOURCE BREEZE PO LIQD CUSTOM
1.0000 | Freq: Three times a day (TID) | ORAL | Status: DC
  Administered 2017-06-10 – 2017-06-15 (×13): 1 via ORAL

## 2017-06-10 MED ORDER — PROCHLORPERAZINE EDISYLATE 5 MG/ML IJ SOLN
5.0000 mg | Freq: Once | INTRAMUSCULAR | Status: AC
  Administered 2017-06-10: 5 mg via INTRAVENOUS
  Filled 2017-06-10: qty 2

## 2017-06-10 MED ORDER — ORAL CARE MOUTH RINSE
15.0000 mL | Freq: Two times a day (BID) | OROMUCOSAL | Status: DC
  Administered 2017-06-10 – 2017-06-15 (×8): 15 mL via OROMUCOSAL

## 2017-06-10 NOTE — Consult Note (Signed)
Physical Medicine and Rehabilitation Consult   Reason for Consult: Functional deficits due to ataxia and double vision.  Referring Physician: Dr. Maryland Pink   HPI: [name] is a 67 y.o. male with history of celiac disease, macular degeneration, depression, peripheral neuropathy, double vision X 2 weeks with difficulty walking, nausea/vomiting and  was admitted on 06/09/17 with progressive symptoms. Patient with poor intake, issues with N/V and has had 30 lbs weight loss in the past 6 weeks. He had been  evaluated by ophthalmology with diagnosis of  CN VI palsy and  MRI brain negative on 4/9.  Repeat MRI brain negative for acute changes. He was started on IV thiamine due to concerns of Wernicke's encephalopathy and  Parental B 12 supplement with recommendations to keep B 12 level > 450.  Patient with significant ataxia and CIR recommended due to follow up therapy.    Review of Systems  Constitutional: Negative for diaphoresis.  HENT: Negative for hearing loss and tinnitus.   Eyes: Positive for double vision (in right field.  has been using eye patch on left ). Negative for blurred vision.  Respiratory: Negative for cough and shortness of breath.   Cardiovascular: Negative for chest pain and palpitations.  Gastrointestinal: Positive for constipation (no BM for few days) and nausea. Negative for heartburn.  Genitourinary: Negative for dysuria and urgency.  Musculoskeletal: Negative for joint pain, myalgias and neck pain.  Neurological: Positive for sensory change (numbness bilateral feet with balance deficits) and headaches.  Psychiatric/Behavioral: The patient is nervous/anxious. The patient does not have insomnia.     Past Medical History:  Diagnosis Date  . Anemia, unspecified   . BPH (benign prostatic hyperplasia)   . Bradycardia   . Celiac disease   . Chronic sinusitis   . Depression   . Dizziness and giddiness   . Elevated PSA measurement   . Enlarged prostate   .  Glaucoma   . Gout   . Hernia of abdominal cavity   . Hypercholesteremia   . Hyperkalemia   . Hyperlipemia   . Hypertension   . Lactose intolerance   . Major depressive disorder   . Malignant neoplasm of prostate ([place])   . Neuropathy   . Osteoarthritis of right knee   . Peptic ulcer   . Polyneuropathy   . Postgastric surgery syndromes   . Tachycardia   . Tachycardia     Past Surgical History:  Procedure Laterality Date  . HERNIA REPAIR     x 5  . MASTECTOMY Right   . SHOULDER SURGERY Right   . SMALL BOWEL REPAIR     for ulcer     Family History  Problem Relation Age of Onset  . Alzheimer's disease Mother   . Dementia Mother   . Pulmonary disease Mother   . Heart disease Father   . Arrhythmia Father        pacemaker  . Prostate cancer Father   . Healthy Sister   . Healthy Brother   . Healthy Daughter     Social History:  Married. Independent without AD till a few weeks ago. He reports that he quit smoking about 37 years ago. He has never used smokeless tobacco. He reports that he does not drink alcohol or use drugs.    Allergies  Allergen Reactions  . Gabapentin Other (See Comments)    Exacerbated neuropathy symptoms  . Gluten Meal Diarrhea    Gas, bloating  . Lactose Diarrhea  Medications Prior to Admission  Medication Sig Dispense Refill  . allopurinol (ZYLOPRIM) 100 MG tablet Take 100 mg by mouth 2 (two) times daily.     Marland Kitchen amitriptyline (ELAVIL) 25 MG tablet Take 25-50 mg by mouth at bedtime.     Marland Kitchen azelastine (ASTELIN) 0.1 % nasal spray Place 2 sprays into both nostrils 2 (two) times daily.     . DULoxetine (CYMBALTA) 60 MG capsule Take 60 mg by mouth daily.    Marland Kitchen loratadine (CLARITIN) 10 MG tablet Take 10 mg by mouth daily.    Marland Kitchen losartan (COZAAR) 50 MG tablet Take 50 mg by mouth daily.     Marland Kitchen omeprazole (PRILOSEC) 40 MG capsule Take 40 mg by mouth 2 (two) times daily.    . pantoprazole (PROTONIX) 40 MG tablet Take 40 mg by mouth daily.    . pindolol  (VISKEN) 5 MG tablet Take 0.5 tablets (2.5 mg total) by mouth 2 (two) times daily. 90 tablet 3  . ranitidine (ZANTAC) 150 MG capsule Take 150 mg by mouth daily.    . simvastatin (ZOCOR) 40 MG tablet Take 40 mg by mouth daily.    . timolol (TIMOPTIC) 0.5 % ophthalmic solution Place 1 drop into the left eye 2 (two) times daily.     Marland Kitchen latanoprost (XALATAN) 0.005 % ophthalmic solution Place 1 drop into the left eye 2 (two) times daily.      Home: Home Living Family/patient expects to be discharged to:: Private residence Living Arrangements: Spouse/significant other Available Help at Discharge: Family, Available 24 hours/day Type of Home: House Home Access: Stairs to enter [employer] of Steps: 1 Entrance Stairs-Rails: None Home Layout: Two level Alternate Level Stairs-Number of Steps: flight  Alternate Level Stairs-Rails: Right Bathroom Shower/Tub: Chiropodist: Standard Home Equipment: Cane - single point  Functional History: Prior Function Level of Independence: Independent Comments: Unsteady with gait and has had a fall within the past week. Previously was independent without any deficits.  Functional Status:  Mobility: Bed Mobility Overal bed mobility: Needs Assistance Bed Mobility: Supine to Sit, Sit to Supine Supine to sit: Supervision Sit to supine: Supervision General bed mobility comments: Supervision for safety. Increased time required to perform.  Transfers Overall transfer level: Needs assistance Equipment used: None Transfers: Sit to/from Stand Sit to Stand: Min assist, Mod assist General transfer comment: Pt requiring min to mod A for lift assist and steadying. Pt with notable sway in standing and required min A to maintain static standing balance.  Ambulation/Gait Ambulation/Gait assistance: Min assist, Mod assist Ambulation Distance (Feet): 50 Feet Assistive device: Rolling walker (2 wheeled) Gait Pattern/deviations: Step-through  pattern, Decreased stride length, Ataxic, Narrow base of support General Gait Details: Slow, very unsteady gait, even with use of AD. Notable ataxia in LEs. Min to mod A for steadying throughout. Required cues for safe use of RW during gait. Pt also reports visual deficits and reports "seeing multiples" when ambulating through hall.  Gait velocity: Decreased  Gait velocity interpretation: <1.31 ft/sec, indicative of household ambulator    ADL:    Cognition: Cognition Overall Cognitive Status: Impaired/Different from baseline Orientation Level: Oriented to person, Oriented to place, Oriented to time, Oriented to situation Cognition Arousal/Alertness: Awake/alert Behavior During Therapy: WFL for tasks assessed/performed Overall Cognitive Status: Impaired/Different from baseline Area of Impairment: Memory Memory: Decreased short-term memory General Comments: Pt's daughter reports deficits with memory that have worsened recently.   Blood pressure 107/66, pulse (!) 52, temperature 98 F (36.7 C), temperature  source Oral, resp. rate 16, height 5\' 11"  (1.803 m), weight 110.7 kg (244 lb 0.8 oz), SpO2 91 %. Physical Exam  Nursing note and vitals reviewed. Constitutional: He is oriented to person, place, and time. He appears well-developed and well-nourished. No distress.  HENT:  Head: Normocephalic and atraumatic.  Mouth/Throat: Oropharynx is clear and moist.  Eyes: Pupils are equal, round, and reactive to light. Conjunctivae are normal. Scleral icterus is present.  Neck: Normal range of motion. Neck supple.  Cardiovascular: Normal rate and regular rhythm.  Respiratory: Effort normal and breath sounds normal. No stridor.  GI: Soft. Bowel sounds are normal. He exhibits no distension. There is no tenderness.  Musculoskeletal:  1+ pedal edema bilaterally.   Neurological: He is alert and oriented to person, place, and time.  Nystagmus right lateral field. Has diplopia with confrontation  testing, left eye lags during tracking. No focal limb ataxia, does have fine motor tremor. Decreased LT in bilateral feet and lower legs. Gait is ataxic but patient uses good safety judgement with therapy. Motor 5/5 in all 4 limbs.   Skin: Skin is warm and dry. He is not diaphoretic.  Psychiatric: He has a normal mood and affect. His behavior is normal.    Results for orders placed or performed during the hospital encounter of 06/09/17 (from the past 24 hour(s))  Ammonia     Status: None   Collection Time: 06/09/17  5:07 PM  Result Value Ref Range   Ammonia 15 9 - 35 umol/L  Vitamin B12     Status: None   Collection Time: 06/09/17  8:50 PM  Result Value Ref Range   Vitamin B-12 352 180 - 914 pg/mL  Folate     Status: None   Collection Time: 06/09/17  8:50 PM  Result Value Ref Range   Folate 9.1 >5.9 ng/mL  Basic metabolic panel     Status: Abnormal   Collection Time: 06/10/17  4:58 AM  Result Value Ref Range   Sodium 135 135 - 145 mmol/L   Potassium 3.7 3.5 - 5.1 mmol/L   Chloride 103 101 - 111 mmol/L   CO2 22 22 - 32 mmol/L   Glucose, Bld 121 (H) 65 - 99 mg/dL   BUN 14 6 - 20 mg/dL   Creatinine, Ser 0.94 0.61 - 1.24 mg/dL   Calcium 9.0 8.9 - 10.3 mg/dL   GFR calc non Af Amer >60 >60 mL/min   GFR calc Af Amer >60 >60 mL/min   Anion gap 10 5 - 15  CBC     Status: Abnormal   Collection Time: 06/10/17  4:58 AM  Result Value Ref Range   WBC 8.7 4.0 - 10.5 K/uL   RBC 4.28 4.22 - 5.81 MIL/uL   Hemoglobin 12.9 (L) 13.0 - 17.0 g/dL   HCT 37.1 (L) 39.0 - 52.0 %   MCV 86.7 78.0 - 100.0 fL   MCH 30.1 26.0 - 34.0 pg   MCHC 34.8 30.0 - 36.0 g/dL   RDW 13.5 11.5 - 15.5 %   Platelets 169 150 - 400 K/uL  Glucose, capillary     Status: Abnormal   Collection Time: 06/10/17  6:08 AM  Result Value Ref Range   Glucose-Capillary 100 (H) 65 - 99 mg/dL   Comment 1 Notify RN    Comment 2 Document in Chart    Dg Chest 1 View  Result Date: 06/09/2017 CLINICAL DATA:  Dizziness and nausea.  EXAM: CHEST  1 VIEW COMPARISON:  Chest  x-ray dated March 16, 2007. FINDINGS: The heart size and mediastinal contours are within normal limits. Normal pulmonary vascularity. No focal consolidation, pleural effusion, or pneumothorax. No acute osseous abnormality. Interval right shoulder arthroplasty, partially visualized. IMPRESSION: No active disease. Electronically Signed   By: Titus Dubin M.D.   On: 06/09/2017 17:25   Mr Brain Wo Contrast  Result Date: 06/09/2017 CLINICAL DATA:  Diplopia and left eye ophthalmoplegia. EXAM: MRI HEAD WITHOUT CONTRAST TECHNIQUE: Multiplanar, multiecho pulse sequences of the brain and surrounding structures were obtained without intravenous contrast. COMPARISON:  06/02/2017 brain MRI FINDINGS: BRAIN: The midline structures are normal. There is no acute infarct or acute hemorrhage. No mass lesion, hydrocephalus, dural abnormality or extra-axial collection. Minimal white matter hyperintensity, nonspecific and commonly seen in asymptomatic patients of this age. Focus of hyperintense T2-weighted signal in the right parietal white matter is favored to be a prominent perivascular space. No age-advanced or lobar predominant atrophy. No chronic microhemorrhage or superficial siderosis. VASCULAR: Major intracranial arterial and venous sinus flow voids are preserved. SKULL AND UPPER CERVICAL SPINE: The visualized skull base, calvarium, upper cervical spine and extracranial soft tissues are normal. SINUSES/ORBITS: No fluid levels or advanced mucosal thickening. No mastoid or middle ear effusion. Normal orbits. IMPRESSION: No acute intracranial abnormality or causative finding of diplopia. Electronically Signed   By: Ulyses Jarred M.D.   On: 06/09/2017 22:48   Ct Abdomen Pelvis W Contrast  Result Date: 06/09/2017 CLINICAL DATA:  Generalized weakness. Nausea and vomiting for 2 weeks EXAM: CT ABDOMEN AND PELVIS WITH CONTRAST TECHNIQUE: Multidetector CT imaging of the abdomen and pelvis  was performed using the standard protocol following bolus administration of intravenous contrast. CONTRAST:  137mL ISOVUE-300 IOPAMIDOL (ISOVUE-300) INJECTION 61% COMPARISON:  None. FINDINGS: Lower chest: Lung bases are clear. Hepatobiliary: No focal hepatic lesion. No biliary duct dilatation. Gallbladder is normal. Common bile duct is normal. Pancreas: Pancreas is normal. No ductal dilatation. No pancreatic inflammation. Spleen: Normal spleen Adrenals/urinary tract: Adrenal glands and kidneys are normal. The ureters and bladder normal. Stomach/Bowel: Stomach is normal. There is a gastroenteric anastomosis along the gastric antral region anteriorly. Duodenum and small-bowel are normal without evidence of inflammation or obstruction. There is 2 adjacent midline ventral hernias which contain loops of nonobstructed small bowel. Distal small bowel leading up the terminal ileum is normal. Appendix normal. Ascending, transverse, and descending colon are normal. Vascular/Lymphatic: Abdominal aorta is normal caliber with atherosclerotic calcification. There is no retroperitoneal or periportal lymphadenopathy. No pelvic lymphadenopathy. Reproductive: Post prostatectomy Other: No free fluid.  LEFT inguinal hernia repair. Musculoskeletal: No aggressive osseous lesion. IMPRESSION: 1. Two midline ventral hernias contain loops of nonobstructed small bowel. 2. Postsurgical change in the gastric antrum with gastroenteric anastomosis. No complication. 3. No acute abdominopelvic findings. Electronically Signed   By: Suzy Bouchard M.D.   On: 06/09/2017 22:19   Dg Abd 2 Views  Result Date: 06/09/2017 CLINICAL DATA:  Vomiting EXAM: ABDOMEN - 2 VIEW COMPARISON:  None. FINDINGS: Supine and upright images obtained. There is moderate stool in the colon. There is no evident bowel dilatation or air-fluid level to suggest bowel obstruction. No free air. There is a surgical clip in the medial left upper quadrant. Visualized lung bases  are clear. There are is a phlebolith in the lower left pelvis. IMPRESSION: Moderate stool in colon.  No evident bowel obstruction or free air. Electronically Signed   By: Lowella Grip III M.D.   On: 06/09/2017 19:10    Assessment/Plan: Diagnosis: Gait disorder/ataxia/visual-spatial  deficits related to ?B12 malabsorption/deficiency. Has hx of peripheral neuropathy as well 1. Does the need for close, 24 hr/day medical supervision in concert with the patient's rehab needs make it unreasonable for this patient to be served in a less intensive setting? Yes 2. Co-Morbidities requiring supervision/potential complications: HTN, Gout, celiac disease 3. Due to bladder management, bowel management, safety, skin/wound care, disease management, medication administration, pain management and patient education, does the patient require 24 hr/day rehab nursing? Yes 4. Does the patient require coordinated care of a physician, rehab nurse, PT (1-2 hrs/day, 5 days/week) and OT (1-2 hrs/day, 5 days/week) to address physical and functional deficits in the context of the above medical diagnosis(es)? Yes Addressing deficits in the following areas: balance, endurance, locomotion, strength, transferring, bowel/bladder control, bathing, dressing, feeding, grooming, toileting and psychosocial support 5. Can the patient actively participate in an intensive therapy program of at least 3 hrs of therapy per day at least 5 days per week? Yes 6. The potential for patient to make measurable gains while on inpatient rehab is excellent 7. Anticipated functional outcomes upon discharge from inpatient rehab are modified independent and supervision  with PT, modified independent and supervision with OT, n/a with SLP. 8. Estimated rehab length of stay to reach the above functional goals is: 7-11 days 9. Anticipated D/C setting: Home 10. Anticipated post D/C treatments: HH therapy/outpt therapy 11. Overall Rehab/Functional Prognosis:  excellent  RECOMMENDATIONS: This patient's condition is appropriate for continued rehabilitative care in the following setting: CIR Patient has agreed to participate in recommended program. Yes Note that insurance prior authorization may be required for reimbursement for recommended care.  Comment: Rehab Admissions Coordinator to follow up.  Thanks,  Meredith Staggers, MD, Mellody Drown    Bary Leriche, PA-C 06/10/2017

## 2017-06-10 NOTE — Evaluation (Signed)
Clinical/Bedside Swallow Evaluation Patient Details  Name: [name] MRN: [id number] Date of Birth: 05-Apr-1950  Today's Date: 06/10/2017 Time: SLP Start Time (ACUTE ONLY): 1400 SLP Stop Time (ACUTE ONLY): 1415 SLP Time Calculation (min) (ACUTE ONLY): 15 min  Past Medical History:  Past Medical History:  Diagnosis Date  . Anemia, unspecified   . BPH (benign prostatic hyperplasia)   . Bradycardia   . Celiac disease   . Chronic sinusitis   . Depression   . Dizziness and giddiness   . Elevated PSA measurement   . Enlarged prostate   . Glaucoma   . Gout   . Hernia of abdominal cavity   . Hypercholesteremia   . Hyperkalemia   . Hyperlipemia   . Hypertension   . Lactose intolerance   . Major depressive disorder   . Malignant neoplasm of prostate ([place])   . Neuropathy   . Osteoarthritis of right knee   . Peptic ulcer   . Polyneuropathy   . Postgastric surgery syndromes   . Tachycardia   . Tachycardia    Past Surgical History:  Past Surgical History:  Procedure Laterality Date  . HERNIA REPAIR     x 5  . MASTECTOMY Right   . SHOULDER SURGERY Right   . SMALL BOWEL REPAIR     for ulcer    HPI:  67 year old man with a history of celiac disease presenting with bilateral partial cranial nerve VI palsies, gait ataxia, progressive cognitive deficit with a more severe progression over the past 1 week.  Daughter reports pt has been on clear liquid diet for one full week prior to this admission due to recent GI issues.     Assessment / Plan / Recommendation Clinical Impression  Pt presents with normal oropharyngeal swallow with adequate oral preparation/control, brisk swallow response, no s/s of aspiration.  Pt passed three oz water test.  No signs of a mechanical dysphagia, however pt should resume clear liquid diet as prescribed PTA until he is cleared by MD for regular solids.  No further SLP f/u is needed.  SLP Visit Diagnosis: Dysphagia, unspecified (R13.10)    Aspiration  Risk  No limitations    Diet Recommendation   clear liquids until advanced per MD  Medication Administration: Whole meds with liquid    Other  Recommendations Oral Care Recommendations: Oral care BID   Follow up Recommendations        Frequency and Duration            Prognosis Prognosis for Safe Diet Advancement: Good      Swallow Study   General HPI: 67 year old man with a history of celiac disease presenting with bilateral partial cranial nerve VI palsies, gait ataxia, progressive cognitive deficit with a more severe progression over the past 1 week.  Daughter reports pt has been on clear liquid diet for one full week given recent GI issues.   Type of Study: Bedside Swallow Evaluation Previous Swallow Assessment: no Diet Prior to this Study: NPO Temperature Spikes Noted: No Respiratory Status: Room air History of Recent Intubation: No Behavior/Cognition: Alert;Cooperative Oral Cavity Assessment: Within Functional Limits Oral Care Completed by SLP: No Oral Cavity - Dentition: Missing dentition Vision: Functional for self-feeding Self-Feeding Abilities: Able to feed self Patient Positioning: Upright in bed Baseline Vocal Quality: Normal Volitional Cough: Strong Volitional Swallow: Able to elicit    Oral/Motor/Sensory Function Overall Oral Motor/Sensory Function: Within functional limits   Ice Chips Ice chips: Within functional limits   Thin Liquid  Thin Liquid: Within functional limits Presentation: Straw    Nectar Thick Nectar Thick Liquid: Not tested   Honey Thick Honey Thick Liquid: Not tested   Puree Puree: Within functional limits   Solid   GO   Solid: Not tested        [name] 06/10/2017,2:21 PM

## 2017-06-10 NOTE — Progress Notes (Signed)
Pt BP 95/57. Notified MD. Awaiting orders.

## 2017-06-10 NOTE — Progress Notes (Signed)
Patient reports having scant blood in front of underwear this AM

## 2017-06-10 NOTE — Progress Notes (Signed)
Physical Therapy Evaluation Patient Details Name: [name] MRN: [id number] DOB: 03/18/1950 Today's Date: 06/10/2017   History of Present Illness  Pt is a 67 y/o male admitted secondary to worsening cognition, gait ataxia, and double vision. Per MD notes concerning for Wernickes. MRI negative for acute abnormality. PMH includes HTN, gout, celiac disease, and CVA.   Clinical Impression  Pt admitted secondary to problem above with deficits below. Pt very unsteady, even with use of RW, and requiring min to mod A for steadying throughout. Pt with ataxia in LEs during gait and presenting with visual deficits and cognitive deficits as well. Pt is currently a high fall risk, and has had fall at home within the past week. Feel pt is an excellent candidate for CIR, as pt was previously independent with ambulation. Pt also with good family support. Will continue to follow acutely to maximize functional mobility independence and safety.     Follow Up Recommendations CIR    Equipment Recommendations  Rolling walker with 5" wheels    Recommendations for Other Services OT consult     Precautions / Restrictions Precautions Precautions: Fall Precaution Comments: Had a fall within the past week  Restrictions Weight Bearing Restrictions: No      Mobility  Bed Mobility Overal bed mobility: Needs Assistance Bed Mobility: Supine to Sit;Sit to Supine     Supine to sit: Supervision Sit to supine: Supervision   General bed mobility comments: Supervision for safety. Increased time required to perform.   Transfers Overall transfer level: Needs assistance Equipment used: None Transfers: Sit to/from Stand Sit to Stand: Min assist;Mod assist         General transfer comment: Pt requiring min to mod A for lift assist and steadying. Pt with notable sway in standing and required min A to maintain static standing balance.   Ambulation/Gait Ambulation/Gait assistance: Min assist;Mod  assist Ambulation Distance (Feet): 50 Feet Assistive device: Rolling walker (2 wheeled) Gait Pattern/deviations: Step-through pattern;Decreased stride length;Ataxic;Narrow base of support Gait velocity: Decreased  Gait velocity interpretation: <1.31 ft/sec, indicative of household ambulator General Gait Details: Slow, very unsteady gait, even with use of AD. Notable ataxia in LEs. Min to mod A for steadying throughout. Required cues for safe use of RW during gait. Pt also reports visual deficits and reports "seeing multiples" when ambulating through hall.   Stairs            Wheelchair Mobility    Modified Rankin (Stroke Patients Only)       Balance Overall balance assessment: Needs assistance Sitting-balance support: No upper extremity supported;Feet supported Sitting balance-Leahy Scale: Fair     Standing balance support: Bilateral upper extremity supported;During functional activity Standing balance-Leahy Scale: Poor Standing balance comment: Reliant on BUE support and external support for balance.                              Pertinent Vitals/Pain Pain Assessment: No/denies pain    Home Living Family/patient expects to be discharged to:: Private residence Living Arrangements: Spouse/significant other Available Help at Discharge: Family;Available 24 hours/day Type of Home: House Home Access: Stairs to enter Entrance Stairs-Rails: None Entrance Stairs-Number of Steps: 1 Home Layout: Two level Home Equipment: Cane - single point      Prior Function Level of Independence: Independent         Comments: Unsteady with gait and has had a fall within the past week. Previously was independent without any  deficits.      Hand Dominance   Dominant Hand: Right    Extremity/Trunk Assessment   Upper Extremity Assessment Upper Extremity Assessment: Defer to OT evaluation    Lower Extremity Assessment Lower Extremity Assessment: RLE  deficits/detail;LLE deficits/detail;Generalized weakness RLE Deficits / Details: Notable ataxia during gait.  RLE Sensation: history of peripheral neuropathy LLE Deficits / Details: Notable ataxia during gait.  LLE Sensation: history of peripheral neuropathy    Cervical / Trunk Assessment Cervical / Trunk Assessment: Normal  Communication   Communication: No difficulties  Cognition Arousal/Alertness: Awake/alert Behavior During Therapy: WFL for tasks assessed/performed Overall Cognitive Status: Impaired/Different from baseline Area of Impairment: Memory                     Memory: Decreased short-term memory         General Comments: Pt's daughter reports deficits with memory that have worsened recently.       General Comments General comments (skin integrity, edema, etc.): Pt's daughter present during session and expressed concern about pt's current mobility status.     Exercises     Assessment/Plan    PT Assessment Patient needs continued PT services  PT Problem List Decreased strength;Decreased balance;Decreased mobility;Decreased knowledge of use of DME;Decreased knowledge of precautions;Decreased cognition;Decreased coordination;Impaired sensation       PT Treatment Interventions DME instruction;Gait training;Stair training;Functional mobility training;Therapeutic activities;Therapeutic exercise;Balance training;Neuromuscular re-education;Patient/family education    PT Goals (Current goals can be found in the Care Plan section)  Acute Rehab PT Goals Patient Stated Goal: to get better  PT Goal Formulation: With patient Time For Goal Achievement: 06/24/17 Potential to Achieve Goals: Good    Frequency Min 3X/week   Barriers to discharge        Co-evaluation               AM-PAC PT "6 Clicks" Daily Activity  Outcome Measure Difficulty turning over in bed (including adjusting bedclothes, sheets and blankets)?: A Little Difficulty moving from  lying on back to sitting on the side of the bed? : A Little Difficulty sitting down on and standing up from a chair with arms (e.g., wheelchair, bedside commode, etc,.)?: Unable Help needed moving to and from a bed to chair (including a wheelchair)?: A Lot Help needed walking in hospital room?: A Lot Help needed climbing 3-5 steps with a railing? : Total 6 Click Score: 12    End of Session Equipment Utilized During Treatment: Gait belt Activity Tolerance: Patient tolerated treatment well Patient left: in bed;with call bell/phone within reach;with bed alarm set;with family/visitor present Nurse Communication: Mobility status PT Visit Diagnosis: Unsteadiness on feet (R26.81);History of falling (Z91.81);Ataxic gait (R26.0);Other symptoms and signs involving the nervous system (R29.898);Difficulty in walking, not elsewhere classified (R26.2)    Time: 2376-2831 PT Time Calculation (min) (ACUTE ONLY): 28 min   Charges:   PT Evaluation $PT Eval Moderate Complexity: 1 Mod PT Treatments $Gait Training: 8-22 mins   PT G Codes:        Leighton Ruff, PT, DPT  Acute Rehabilitation Services  Pager: [phone]   Rudean Hitt 06/10/2017, 3:26 PM

## 2017-06-10 NOTE — Progress Notes (Signed)
Neurology Progress Note   S:// No acute events overnight   O:// Current vital signs: BP 116/67 (BP Location: Right Arm)   Pulse (!) 58   Temp 98.1 F (36.7 C) (Oral)   Resp 14   Ht 5\' 11"  (1.803 m)   Wt 110.7 kg (244 lb 0.8 oz)   SpO2 92%   BMI 34.04 kg/m  Vital signs in last 24 hours: Temp:  [97.9 F (36.6 C)-98.6 F (37 C)] 98.1 F (36.7 C) (04/17 1109) Pulse Rate:  [58-88] 58 (04/17 1109) Resp:  [11-108] 14 (04/17 1109) BP: (93-151)/(55-101) 116/67 (04/17 1109) SpO2:  [89 %-100 %] 92 % (04/17 1109) Weight:  [110.7 kg (244 lb 0.8 oz)] 110.7 kg (244 lb 0.8 oz) (04/17 3810) Unchanged exam from late last night Neuro: Mental Status: Patient is awake, alert, oriented to person, place, month, year, and situation. Patient is able to give a clear and coherent history. No signs of aphasia or neglect Cranial Nerves: II: Visual Fields are full. Pupils are equal, round, and reactive to light.   III,IV, VI: He has market nystagmus on rightward gaze with bilateral partial abducens palsies. V: Facial sensation is symmetric to temperature VII: Facial movement is symmetric.  VIII: hearing is intact to voice X: Uvula elevates symmetrically XI: Shoulder shrug is symmetric. XII: tongue is midline without atrophy or fasciculations.  Motor: Tone is normal. Bulk is normal. 5/5 strength was present in all four extremities.  Sensory: Sensation is symmetric to light touch and temperature in the arms and legs. Deep Tendon Reflexes: 2+ and symmetric in the biceps and patellae.  Cerebellar: FNF with bilateral intentional tremor and HKS are intact bilaterally Gait testing was deferred at this time.    Medications  Current Facility-Administered Medications:  .  0.9 %  sodium chloride infusion, , Intravenous, Continuous, Ivor Costa, MD, Last Rate: 100 mL/hr at 06/10/17 0018 .  acetaminophen (TYLENOL) tablet 650 mg, 650 mg, Oral, Q6H PRN **OR** acetaminophen (TYLENOL) suppository 650 mg,  650 mg, Rectal, Q6H PRN, Ivor Costa, MD .  allopurinol (ZYLOPRIM) tablet 100 mg, 100 mg, Oral, BID, Ivor Costa, MD .  amitriptyline (ELAVIL) tablet 25-50 mg, 25-50 mg, Oral, QHS, Ivor Costa, MD, 25 mg at 06/10/17 0014 .  azelastine (ASTELIN) 0.1 % nasal spray 2 spray, 2 spray, Each Nare, BID, Ivor Costa, MD .  DULoxetine (CYMBALTA) DR capsule 60 mg, 60 mg, Oral, Daily, Ivor Costa, MD .  enoxaparin (LOVENOX) injection 40 mg, 40 mg, Subcutaneous, Daily, Ivor Costa, MD .  famotidine (PEPCID) tablet 20 mg, 20 mg, Oral, Daily, Ivor Costa, MD, 20 mg at 06/10/17 0012 .  hydrALAZINE (APRESOLINE) injection 5 mg, 5 mg, Intravenous, Q2H PRN, Ivor Costa, MD .  latanoprost (XALATAN) 0.005 % ophthalmic solution 1 drop, 1 drop, Both Eyes, BID, Ivor Costa, MD .  loratadine (CLARITIN) tablet 10 mg, 10 mg, Oral, Daily, Ivor Costa, MD, 10 mg at 06/10/17 0015 .  losartan (COZAAR) tablet 50 mg, 50 mg, Oral, Daily, Ivor Costa, MD .  MEDLINE mouth rinse, 15 mL, Mouth Rinse, BID, Ivor Costa, MD .  ondansetron [facility]) injection 4 mg, 4 mg, Intravenous, Q8H PRN, Ivor Costa, MD .  pantoprazole (PROTONIX) EC tablet 40 mg, 40 mg, Oral, Daily, Ivor Costa, MD, 40 mg at 06/10/17 0013 .  pindolol (VISKEN) tablet 2.5 mg, 2.5 mg, Oral, BID, Ivor Costa, MD, 2.5 mg at 06/10/17 0020 .  polyethylene glycol (MIRALAX / GLYCOLAX) packet 17 g, 17 g, Oral, Daily PRN, Ivor Costa,  MD .  simvastatin (ZOCOR) tablet 40 mg, 40 mg, Oral, Daily, Ivor Costa, MD .  thiamine 500mg  in normal saline (27ml) IVPB, 500 mg, Intravenous, TID, Greta Doom, MD, Stopped at 06/10/17 0057 .  timolol (TIMOPTIC) 0.5 % ophthalmic solution 1 drop, 1 drop, Both Eyes, BID, Ivor Costa, MD .  zolpidem (AMBIEN) tablet 5 mg, 5 mg, Oral, QHS PRN, Ivor Costa, MD Labs CBC    Component Value Date/Time   WBC 8.7 06/10/2017 0458   RBC 4.28 06/10/2017 0458   HGB 12.9 (L) 06/10/2017 0458   HCT 37.1 (L) 06/10/2017 0458   PLT 169 06/10/2017 0458   MCV 86.7  06/10/2017 0458   MCH 30.1 06/10/2017 0458   MCHC 34.8 06/10/2017 0458   RDW 13.5 06/10/2017 0458   LYMPHSABS 1.4 06/02/2017 1126   MONOABS 0.4 06/02/2017 1126   EOSABS 0.1 06/02/2017 1126   BASOSABS 0.0 06/02/2017 1126    CMP     Component Value Date/Time   NA 135 06/10/2017 0458   K 3.7 06/10/2017 0458   CL 103 06/10/2017 0458   CO2 22 06/10/2017 0458   GLUCOSE 121 (H) 06/10/2017 0458   BUN 14 06/10/2017 0458   CREATININE 0.94 06/10/2017 0458   CALCIUM 9.0 06/10/2017 0458   PROT 6.9 06/09/2017 1225   ALBUMIN 4.2 06/09/2017 1225   AST 20 06/09/2017 1225   ALT 15 (L) 06/09/2017 1225   ALKPHOS 88 06/09/2017 1225   BILITOT 0.9 06/09/2017 1225   GFRNONAA >60 06/10/2017 0458   GFRAA >60 06/10/2017 0458  Vitamin B12 level 352, folate 9.1, B1 level pending  Imaging I have reviewed images in epic and the results pertinent to this consultation are:  MRI examination of the brain-no acute changes.  Assessment:  67 year old man with a history of celiac disease presenting with bilateral partial cranial nerve VI palsies, gait ataxia, progressive cognitive deficit with a more severe progression over the past 1 week.  Also has a history of inability to keep his food down and weight loss over the past 6 weeks which raises concerns for Wernicke's encephalopathy. Can consider autoimmune causes given the history of celiac disease, but common things being worked first, Wernicke's should be looked at and treated.  Impression Diplopia, cognitive deficits, gait ataxia-concerning for Wernicke's Can consider autoimmune causes if evaluation for Wernicke's and treatment for Wernicke's is unhelpful.  Recommendations: Folate is normal, B12 although normal, I would recommend keeping higher than 450. Parenteral B12 supplementation Continue with high-dose thiamine for 3 days - 500 mg 3 times a day.  After that convert to p.o. Follow-up with outpatient neurology Follow-up on the B1 levels-might take  a few days to come back -more so to be helpful during outpatient evaluation. Management and follow-up of the weight loss and GI complaints per primary team. Physical therapy, occupational therapy, speech therapy.  Neurology service will sign off for now.  Please call with questions.  -- Amie Portland, MD [facility] Pager: [phone] If 7pm to 7am, please call on call as listed on AMION.

## 2017-06-10 NOTE — Progress Notes (Signed)
PROGRESS NOTE  [name] HWE:[id number] DOB: 08/14/1950 DOA: 06/09/2017 PCP: [name], MD  HPI/Recap of past 83 hours: 67 year old male with past medical history of hypertension, obesity status post gastric bypass several years ago who in the past week has had progressively worsening issues with confusion, difficulty walking, numbness in lower extremities, double vision and nausea/vomiting.  To the point where he is on a clear liquid diet.  Came into the emergency room for further evaluation.  Recent MRI negative.  Seen by neurology who felt this possibly could be vitamin deficiency leading to Warnicke's encephalopathy.  Repeat MRI negative.  Patient started on vitamin replacement.  Labs unequivocal.  Assessment/Plan: Principal Problem:   Double vision/peripheral neuropathy/ataxia: Suspicious for vitamin deficiency.  Given patient's previous history of gastric bypass several years ago, he may have some issues with proper vitamin absorption.  Repeat MRI negative.  Patient has bilateral partial cranial nerve VI palsies, ataxia increased confusion.  Autoimmune may be a possibility, but neurology recommending initially treatment for Warnicke's encephalopathy.  Started on parenteral B12 supplementation plus high-dose thiamine for 3 days.  Given overall weakness, seen by physical therapy who recommended inpatient rehab.  CIR to evaluate if patient acceptable candidate Active Problems:   Benign essential HTN: Blood pressure stable   Gout   Depression   Nausea & vomiting: May be related to Wernicke's versus GI issue.  Patient passed swallow evaluation   GERD (gastroesophageal reflux disease)   HLD (hyperlipidemia) Acute encephalopathy: Improving, may have been in part from dehydration   Code Status: Full code   Family Communication: Wife by phone & daughter at the bedside  Disposition Plan: Depending on if patient accepted for inpatient  rehab   Consultants:  Neurology  Inpatient Rehab   Procedures:  None  Antimicrobials:  None  DVT prophylaxis: Lovenox   Objective: Vitals:   06/10/17 0653 06/10/17 0734 06/10/17 1109 06/10/17 1535  BP:  103/65 116/67 107/66  Pulse:  66 (!) 58 (!) 52  Resp: (!) 108 12 14 16   Temp:  97.9 F (36.6 C) 98.1 F (36.7 C) 98 F (36.7 C)  TempSrc: Oral Oral Oral Oral  SpO2:  (!) 89% 92% 91%  Weight:      Height: 5\' 11"  (1.803 m)       Intake/Output Summary (Last 24 hours) at 06/10/2017 1622 Last data filed at 06/10/2017 0600 Gross per 24 hour  Intake 1470 ml  Output -  Net 1470 ml   Filed Weights   06/10/17 8938  Weight: 110.7 kg (244 lb 0.8 oz)   Body mass index is 34.04 kg/m.  Exam:   General: Alert and oriented x2 currently, however easily goes back to sleep during questioning  HEENT: Normocephalic and atraumatic, mucous memories dry.  Neck is thick, narrow airway  Cardiovascular: Regular rate and rhythm, S1-S2  Respiratory: Clear to auscultation bilaterally  Abdomen: Soft, nontender, nondistended, positive bowel sounds  Musculoskeletal: No clubbing or cyanosis, trace pitting edema bilaterally  Skin: No skin breaks, tears or lesions  Psychiatry: Appropriate, no evidence of psychoses, although somnolent  Neuro: cranial nerve vi palsy bilaterally.   Data Reviewed: CBC: Recent Labs  Lab 06/09/17 1225 06/09/17 1644 06/10/17 0458  WBC 8.4  --  8.7  HGB 15.1 15.6 12.9*  HCT 42.8 46.0 37.1*  MCV 87.2  --  86.7  PLT 189  --  101   Basic Metabolic Panel: Recent Labs  Lab 06/09/17 1225 06/09/17 1644 06/10/17 0458  NA 134*  135 135  K 4.3 4.5 3.7  CL 99* 97* 103  CO2 24  --  22  GLUCOSE 177* 177* 121*  BUN 10 13 14   CREATININE 1.14 0.80 0.94  CALCIUM 9.9  --  9.0   GFR: Estimated Creatinine Clearance: 95.2 mL/min (by C-G formula based on SCr of 0.94 mg/dL). Liver Function Tests: Recent Labs  Lab 06/09/17 1225  AST 20  ALT 15*   ALKPHOS 88  BILITOT 0.9  PROT 6.9  ALBUMIN 4.2   Recent Labs  Lab 06/09/17 1225  LIPASE 31   Recent Labs  Lab 06/09/17 1707  AMMONIA 15   Coagulation Profile: No results for input(s): INR, PROTIME in the last 168 hours. Cardiac Enzymes: No results for input(s): CKTOTAL, CKMB, CKMBINDEX, TROPONINI in the last 168 hours. BNP (last 3 results) No results for input(s): PROBNP in the last 8760 hours. HbA1C: No results for input(s): HGBA1C in the last 72 hours. CBG: Recent Labs  Lab 06/09/17 1247 06/10/17 0608  GLUCAP 160* 100*   Lipid Profile: No results for input(s): CHOL, HDL, LDLCALC, TRIG, CHOLHDL, LDLDIRECT in the last 72 hours. Thyroid Function Tests: No results for input(s): TSH, T4TOTAL, FREET4, T3FREE, THYROIDAB in the last 72 hours. Anemia Panel: Recent Labs    06/09/17 2050  VITAMINB12 352  FOLATE 9.1   Urine analysis:    Component Value Date/Time   COLORURINE YELLOW 06/09/2017 [place] 06/09/2017 1413   LABSPEC 1.025 06/09/2017 1413   PHURINE 8.0 06/09/2017 1413   GLUCOSEU NEGATIVE 06/09/2017 1413   HGBUR NEGATIVE 06/09/2017 1413   BILIRUBINUR NEGATIVE 06/09/2017 1413   KETONESUR 80 (A) 06/09/2017 1413   PROTEINUR NEGATIVE 06/09/2017 1413   UROBILINOGEN 0.2 04/19/2007 1436   NITRITE NEGATIVE 06/09/2017 1413   LEUKOCYTESUR NEGATIVE 06/09/2017 1413   Sepsis Labs: @LABRCNTIP (procalcitonin:4,lacticidven:4)  )No results found for this or any previous visit (from the past 240 hour(s)).    Studies: Dg Chest 1 View  Result Date: 06/09/2017 CLINICAL DATA:  Dizziness and nausea. EXAM: CHEST  1 VIEW COMPARISON:  Chest x-ray dated March 16, 2007. FINDINGS: The heart size and mediastinal contours are within normal limits. Normal pulmonary vascularity. No focal consolidation, pleural effusion, or pneumothorax. No acute osseous abnormality. Interval right shoulder arthroplasty, partially visualized. IMPRESSION: No active disease.  Electronically Signed   By: Titus Dubin M.D.   On: 06/09/2017 17:25   Mr Brain Wo Contrast  Result Date: 06/09/2017 CLINICAL DATA:  Diplopia and left eye ophthalmoplegia. EXAM: MRI HEAD WITHOUT CONTRAST TECHNIQUE: Multiplanar, multiecho pulse sequences of the brain and surrounding structures were obtained without intravenous contrast. COMPARISON:  06/02/2017 brain MRI FINDINGS: BRAIN: The midline structures are normal. There is no acute infarct or acute hemorrhage. No mass lesion, hydrocephalus, dural abnormality or extra-axial collection. Minimal white matter hyperintensity, nonspecific and commonly seen in asymptomatic patients of this age. Focus of hyperintense T2-weighted signal in the right parietal white matter is favored to be a prominent perivascular space. No age-advanced or lobar predominant atrophy. No chronic microhemorrhage or superficial siderosis. VASCULAR: Major intracranial arterial and venous sinus flow voids are preserved. SKULL AND UPPER CERVICAL SPINE: The visualized skull base, calvarium, upper cervical spine and extracranial soft tissues are normal. SINUSES/ORBITS: No fluid levels or advanced mucosal thickening. No mastoid or middle ear effusion. Normal orbits. IMPRESSION: No acute intracranial abnormality or causative finding of diplopia. Electronically Signed   By: Ulyses Jarred M.D.   On: 06/09/2017 22:48   Ct Abdomen Pelvis W  Contrast  Result Date: 06/09/2017 CLINICAL DATA:  Generalized weakness. Nausea and vomiting for 2 weeks EXAM: CT ABDOMEN AND PELVIS WITH CONTRAST TECHNIQUE: Multidetector CT imaging of the abdomen and pelvis was performed using the standard protocol following bolus administration of intravenous contrast. CONTRAST:  135mL ISOVUE-300 IOPAMIDOL (ISOVUE-300) INJECTION 61% COMPARISON:  None. FINDINGS: Lower chest: Lung bases are clear. Hepatobiliary: No focal hepatic lesion. No biliary duct dilatation. Gallbladder is normal. Common bile duct is normal.  Pancreas: Pancreas is normal. No ductal dilatation. No pancreatic inflammation. Spleen: Normal spleen Adrenals/urinary tract: Adrenal glands and kidneys are normal. The ureters and bladder normal. Stomach/Bowel: Stomach is normal. There is a gastroenteric anastomosis along the gastric antral region anteriorly. Duodenum and small-bowel are normal without evidence of inflammation or obstruction. There is 2 adjacent midline ventral hernias which contain loops of nonobstructed small bowel. Distal small bowel leading up the terminal ileum is normal. Appendix normal. Ascending, transverse, and descending colon are normal. Vascular/Lymphatic: Abdominal aorta is normal caliber with atherosclerotic calcification. There is no retroperitoneal or periportal lymphadenopathy. No pelvic lymphadenopathy. Reproductive: Post prostatectomy Other: No free fluid.  LEFT inguinal hernia repair. Musculoskeletal: No aggressive osseous lesion. IMPRESSION: 1. Two midline ventral hernias contain loops of nonobstructed small bowel. 2. Postsurgical change in the gastric antrum with gastroenteric anastomosis. No complication. 3. No acute abdominopelvic findings. Electronically Signed   By: Suzy Bouchard M.D.   On: 06/09/2017 22:19   Dg Abd 2 Views  Result Date: 06/09/2017 CLINICAL DATA:  Vomiting EXAM: ABDOMEN - 2 VIEW COMPARISON:  None. FINDINGS: Supine and upright images obtained. There is moderate stool in the colon. There is no evident bowel dilatation or air-fluid level to suggest bowel obstruction. No free air. There is a surgical clip in the medial left upper quadrant. Visualized lung bases are clear. There are is a phlebolith in the lower left pelvis. IMPRESSION: Moderate stool in colon.  No evident bowel obstruction or free air. Electronically Signed   By: Lowella Grip III M.D.   On: 06/09/2017 19:10    Scheduled Meds: . allopurinol  100 mg Oral BID  . amitriptyline  25-50 mg Oral QHS  . azelastine  2 spray Each Nare  BID  . DULoxetine  60 mg Oral Daily  . enoxaparin (LOVENOX) injection  40 mg Subcutaneous Daily  . famotidine  20 mg Oral Daily  . feeding supplement  1 Container Oral TID BM  . latanoprost  1 drop Both Eyes BID  . loratadine  10 mg Oral Daily  . losartan  50 mg Oral Daily  . mouth rinse  15 mL Mouth Rinse BID  . pantoprazole  40 mg Oral Daily  . pindolol  2.5 mg Oral BID  . simvastatin  40 mg Oral Daily  . timolol  1 drop Both Eyes BID    Continuous Infusions: . sodium chloride 100 mL/hr at 06/10/17 0018  . thiamine injection Stopped (06/10/17 1150)     LOS: 0 days     Annita Brod, MD [facility]  To reach me or the doctor on call, go to: [email or web address] Password [facility]  06/10/2017, 4:22 PM

## 2017-06-10 NOTE — Progress Notes (Signed)
Visit made to patients room to place on CPAP pt had self administered.  Resting well no issues to report.

## 2017-06-10 NOTE — Care Management Note (Signed)
Case Management Note  Patient Details  Name: [name] MRN: [id number] Date of Birth: 1950/11/10  Subjective/Objective:   Pt in with double vision. He is from home with spouse.                  Action/Plan: Plan is for patient to return home when medically stable. CM following for d/c needs, physician orders.   Expected Discharge Date:  06/11/17               Expected Discharge Plan:  Home/Self Care  In-House Referral:     Discharge planning Services     Post Acute Care Choice:    Choice offered to:     DME Arranged:    DME Agency:     HH Arranged:    HH Agency:     Status of Service:  In process, will continue to follow  If discussed at Long Length of Stay Meetings, dates discussed:    Additional Comments:  Pollie Friar, RN 06/10/2017, 11:35 AM

## 2017-06-10 NOTE — Progress Notes (Signed)
Initial Nutrition Assessment  DOCUMENTATION CODES:   Obesity unspecified  INTERVENTION:  Boost Breeze po TID, each supplement provides 250 kcal and 9 grams of protein  If patient is diagnosed with Wernicke's Recommend continue current regimen of 500mg  IV thiamine TID for 3 days followed by 100mg  oral thiamine daily during remaining hospital stay for risk of refeeding.  NUTRITION DIAGNOSIS:   Inadequate oral intake related to nausea, vomiting as evidenced by percent weight loss.  GOAL:   Patient will meet greater than or equal to 90% of their needs  MONITOR:   PO intake, Diet advancement, Supplement acceptance  REASON FOR ASSESSMENT:   Malnutrition Screening Tool    ASSESSMENT:   [name] is a 67 y.o. male with medical history significant of hypertension, hyperlipidemia, GERD, gout, depression, peptic ulcer disease, BPH, prostate cancer (surgery, no radiation or chemotherapy), celiac disease, who presents with double vision, poor balance, nausea, vomiting. N/V has been going for 1 week.   Patient able to provide some history, some from daughter as well. He had been struggling with PO intake for 6 weeks and had on and off nausea and vomiting x1 week. Patient unable to provide extensive nutrition history but does state he was only drinking liquids for the past week such as chicken broth, egg drop soup, and tomato basil soup. In the previous weeks he states he was still eating 3 meals a day. Daughter reports 13 pound/5.1% severe weight loss over 1 month. Unsure of UBW. Per chart appears he was between 262-265 pounds over the past 1.5 yrs. Some nausea today but did not vomit.  Labs reviewed Medications reviewed and include:  NS at 123mL/hr   NUTRITION - FOCUSED PHYSICAL EXAM:    Most Recent Value  Orbital Region  No depletion  Upper Arm Region  No depletion  Thoracic and Lumbar Region  No depletion  Buccal Region  No depletion  Temple Region  No depletion  Clavicle  Bone Region  No depletion  Clavicle and Acromion Bone Region  No depletion  Scapular Bone Region  No depletion  Dorsal Hand  No depletion  Patellar Region  No depletion  Anterior Thigh Region  No depletion  Posterior Calf Region  No depletion       Diet Order:  Diet clear liquid Room service appropriate? Yes; Fluid consistency: Thin  EDUCATION NEEDS:   Not appropriate for education at this time  Skin:  Skin Assessment: Reviewed RN Assessment  Last BM:  PTA  Height:   Ht Readings from Last 1 Encounters:  06/10/17 5\' 11"  (1.803 m)    Weight:   Wt Readings from Last 1 Encounters:  06/10/17 244 lb 0.8 oz (110.7 kg)    Ideal Body Weight:  78.18 kg  BMI:  Body mass index is 34.04 kg/m.  Estimated Nutritional Needs:   Kcal:  2200-2400 calories (MSJ x1.2-1.3)  Protein:  133-166 grams  Fluid:  2.2-2.4L  [name]. [name] Bottomley, MS, RD LDN Inpatient Clinical Dietitian Pager [phone]

## 2017-06-10 NOTE — Progress Notes (Signed)
Pt arrived from ED A&OX4, presenting with nausea and vomiting. Wife at bedside and stable at this time. Vitals were taken, cardiac monitor set up and telemetry notified/ verified. Administered medications and will continue to monitor pt condition and nausea. Safety and plan of care reviewed with pt and spouse, & both verbalized understanding, call bell within reach for assistance.

## 2017-06-11 ENCOUNTER — Other Ambulatory Visit: Payer: 59

## 2017-06-11 LAB — GLUCOSE, CAPILLARY: GLUCOSE-CAPILLARY: 88 mg/dL (ref 65–99)

## 2017-06-11 NOTE — Progress Notes (Signed)
Physical Therapy Treatment Patient Details Name: [name] MRN: [id number] DOB: 11/02/1950 Today's Date: 06/11/2017    History of Present Illness Pt is a 67 y/o male admitted secondary to worsening cognition, gait ataxia, and double vision. Per MD notes concerning for Wernickes. MRI negative for acute abnormality. PMH includes HTN, gout, celiac disease, and CVA.     PT Comments    Patient tolerated session well and eager to participate. Pt required min/mod A for gait training due to ataxic gait pattern and multiple LOB without AD. Patient continues to c/o of double vision and dizziness. Patient continues to be a good candidate for CIR level therapies.    Follow Up Recommendations  CIR     Equipment Recommendations  Rolling walker with 5" wheels    Recommendations for Other Services OT consult     Precautions / Restrictions Precautions Precautions: Fall Precaution Comments: Had a fall within the past week  Restrictions Weight Bearing Restrictions: No    Mobility  Bed Mobility Overal bed mobility: Needs Assistance Bed Mobility: Supine to Sit     Supine to sit: Supervision     General bed mobility comments: Supervision for safety   Transfers Overall transfer level: Needs assistance Equipment used: None Transfers: Sit to/from Stand Sit to Stand: Min assist         General transfer comment: min A to steady upon standing; cues for safe hand placement  Ambulation/Gait Ambulation/Gait assistance: Min assist;Mod assist Ambulation Distance (Feet): 200 Feet Assistive device: Rolling walker (2 wheeled);None Gait Pattern/deviations: Step-through pattern;Decreased stride length;Ataxic;Staggering left;Staggering right;Narrow base of support Gait velocity: Decreased    General Gait Details: initially ambulated with RW and then without AD; pt required mod A without use of AD due to multiple LOB; pt very guarded and unable to maintain balance with head turns and  directional changes   Stairs Stairs: Yes Stairs assistance: Min guard Stair Management: One rail Right;Step to pattern;Forwards Number of Stairs: 3 General stair comments: min guard for safety; cues for step to pattern and safety   Wheelchair Mobility    Modified Rankin (Stroke Patients Only)       Balance Overall balance assessment: Needs assistance Sitting-balance support: No upper extremity supported;Feet supported Sitting balance-Leahy Scale: Fair     Standing balance support: Bilateral upper extremity supported;During functional activity Standing balance-Leahy Scale: Poor                              Cognition Arousal/Alertness: Awake/alert Behavior During Therapy: WFL for tasks assessed/performed Overall Cognitive Status: Impaired/Different from baseline Area of Impairment: Memory                   Current Attention Level: Selective Memory: Decreased short-term memory                Exercises      General Comments        Pertinent Vitals/Pain Pain Assessment: No/denies pain    Home Living                      Prior Function            PT Goals (current goals can now be found in the care plan section) Acute Rehab PT Goals Patient Stated Goal: to get better  PT Goal Formulation: With patient Time For Goal Achievement: 06/24/17 Potential to Achieve Goals: Good Progress towards PT goals: Progressing toward goals  Frequency    Min 3X/week      PT Plan Current plan remains appropriate    Co-evaluation              AM-PAC PT "6 Clicks" Daily Activity  Outcome Measure  Difficulty turning over in bed (including adjusting bedclothes, sheets and blankets)?: A Little Difficulty moving from lying on back to sitting on the side of the bed? : A Little Difficulty sitting down on and standing up from a chair with arms (e.g., wheelchair, bedside commode, etc,.)?: Unable Help needed moving to and from a bed  to chair (including a wheelchair)?: A Lot Help needed walking in hospital room?: A Lot Help needed climbing 3-5 steps with a railing? : Total 6 Click Score: 12    End of Session Equipment Utilized During Treatment: Gait belt Activity Tolerance: Patient tolerated treatment well Patient left: with call bell/phone within reach;in chair Nurse Communication: Mobility status PT Visit Diagnosis: Unsteadiness on feet (R26.81);History of falling (Z91.81);Ataxic gait (R26.0);Other symptoms and signs involving the nervous system (R29.898);Difficulty in walking, not elsewhere classified (R26.2)     Time: 4163-8453 PT Time Calculation (min) (ACUTE ONLY): 22 min  Charges:  $Gait Training: 8-22 mins                    G Codes:       Earney Navy, PTA Pager: [phone]     Darliss Cheney 06/11/2017, 4:18 PM

## 2017-06-11 NOTE — Progress Notes (Signed)
PROGRESS NOTE  [name] EUM:[id number] DOB: [birth year] DOA: 06/09/2017 PCP: [name], MD  HPI/Recap of past 28 hours: 67 year old male with past medical history of hypertension, obesity status post gastric bypass 10 years prior who in the past week has had progressively worsening issues with confusion, difficulty walking, numbness in lower extremities, double vision and nausea/vomiting. Came into the emergency room for further evaluation.  Recent MRI negative.  Seen by neurology who felt this possibly could be vitamin deficiency leading to Warnicke's encephalopathy.  Repeat MRI negative.  Patient started on vitamin replacement.  Labs unremarkable.  Overnight no issues, patient feeling much better.  Diet able to be slowly advanced currently on soft bland diet which patient is tolerating well.  Seen by physical therapy who are recommending inpatient rehab.  Patient seen by CIR who accepted patient, they will take him on 4/19.  Patient himself with no complaints other than some mild dizziness.  Happy to be eating.  He has some mild abdominal soreness in the left lower quadrant, possibly at site of Lovenox injection.  Otherwise doing well  Assessment/Plan: Principal Problem:   Double vision/peripheral neuropathy/ataxia: Suspicious for vitamin deficiency.  Given patient's previous history of gastric bypass 10-15 years ago, he may have some issues with proper vitamin absorption.  He was hospitalized for IV vitamin infusion 10 years ago.  Repeat MRI negative.  Patient has bilateral partial cranial nerve VI palsies, ataxia increased confusion.  Autoimmune may be a possibility, but neurology recommending initially treatment for Warnicke's encephalopathy.  Started on parenteral B12 supplementation plus high-dose thiamine for 3 days.  Accepted for CIR, will go tomorrow. Active Problems:   Benign essential HTN: Blood pressure soft, has since then been improving.   Gout   Depression   Nausea &  vomiting: May be related to Wernicke's.  Seems to be resolving, tolerating bland diet   GERD (gastroesophageal reflux disease)   HLD (hyperlipidemia) Acute encephalopathy: Improving, may have been in part from dehydration Sleep apnea: Continue home CPAP   Code Status: Full code   Family Communication: Wife by phone & daughter at the bedside  Disposition Plan: CIR tomorrow   Consultants:  Neurology  Inpatient Rehab   Procedures:  None  Antimicrobials:  None  DVT prophylaxis: Lovenox   Objective: Vitals:   06/10/17 2320 06/11/17 0327 06/11/17 0731 06/11/17 1245  BP: 105/69 103/81 (!) 101/57 100/69  Pulse: (!) 53 (!) 131 (!) 56 (!) 53  Resp: 16 20 20 16   Temp: 97.9 F (36.6 C) (!) 97.5 F (36.4 C) 97.7 F (36.5 C) 98 F (36.7 C)  TempSrc: Oral Oral Oral Oral  SpO2: 99% 99% 97% 98%  Weight:      Height:        Intake/Output Summary (Last 24 hours) at 06/11/2017 1505 Last data filed at 06/11/2017 0600 Gross per 24 hour  Intake 700 ml  Output 1100 ml  Net -400 ml   Filed Weights   06/10/17 5400  Weight: 110.7 kg (244 lb 0.8 oz)   Body mass index is 34.04 kg/m.  Exam:   General: Alert and oriented x3 currently, no acute distress  HEENT: Normocephalic and atraumatic, mucous memories dry.  Neck is thick, narrow airway  Cardiovascular: Regular rate and rhythm, S1-S2  Respiratory: Clear to auscultation bilaterally  Abdomen: Soft, nontender, nondistended, positive bowel sounds  Musculoskeletal: No clubbing or cyanosis, trace pitting edema bilaterally  Skin: No skin breaks, tears or lesions  Psychiatry: Appropriate, no evidence of  psychoses, although somnolent  Neuro: No focal deficits   Data Reviewed: CBC: Recent Labs  Lab 06/09/17 1225 06/09/17 1644 06/10/17 0458  WBC 8.4  --  8.7  HGB 15.1 15.6 12.9*  HCT 42.8 46.0 37.1*  MCV 87.2  --  86.7  PLT 189  --  381   Basic Metabolic Panel: Recent Labs  Lab 06/09/17 1225 06/09/17 1644  06/10/17 0458  NA 134* 135 135  K 4.3 4.5 3.7  CL 99* 97* 103  CO2 24  --  22  GLUCOSE 177* 177* 121*  BUN 10 13 14   CREATININE 1.14 0.80 0.94  CALCIUM 9.9  --  9.0   GFR: Estimated Creatinine Clearance: 95.2 mL/min (by C-G formula based on SCr of 0.94 mg/dL). Liver Function Tests: Recent Labs  Lab 06/09/17 1225  AST 20  ALT 15*  ALKPHOS 88  BILITOT 0.9  PROT 6.9  ALBUMIN 4.2   Recent Labs  Lab 06/09/17 1225  LIPASE 31   Recent Labs  Lab 06/09/17 1707  AMMONIA 15   Coagulation Profile: No results for input(s): INR, PROTIME in the last 168 hours. Cardiac Enzymes: No results for input(s): CKTOTAL, CKMB, CKMBINDEX, TROPONINI in the last 168 hours. BNP (last 3 results) No results for input(s): PROBNP in the last 8760 hours. HbA1C: No results for input(s): HGBA1C in the last 72 hours. CBG: Recent Labs  Lab 06/09/17 1247 06/10/17 0608 06/11/17 0628  GLUCAP 160* 100* 88   Lipid Profile: No results for input(s): CHOL, HDL, LDLCALC, TRIG, CHOLHDL, LDLDIRECT in the last 72 hours. Thyroid Function Tests: No results for input(s): TSH, T4TOTAL, FREET4, T3FREE, THYROIDAB in the last 72 hours. Anemia Panel: Recent Labs    06/09/17 2050  VITAMINB12 352  FOLATE 9.1   Urine analysis:    Component Value Date/Time   COLORURINE YELLOW 06/09/2017 [place] 06/09/2017 1413   LABSPEC 1.025 06/09/2017 1413   PHURINE 8.0 06/09/2017 1413   GLUCOSEU NEGATIVE 06/09/2017 1413   HGBUR NEGATIVE 06/09/2017 1413   BILIRUBINUR NEGATIVE 06/09/2017 1413   KETONESUR 80 (A) 06/09/2017 1413   PROTEINUR NEGATIVE 06/09/2017 1413   UROBILINOGEN 0.2 04/19/2007 1436   NITRITE NEGATIVE 06/09/2017 1413   LEUKOCYTESUR NEGATIVE 06/09/2017 1413   Sepsis Labs: @LABRCNTIP (procalcitonin:4,lacticidven:4)  )No results found for this or any previous visit (from the past 240 hour(s)).    Studies: No results found.  Scheduled Meds: . allopurinol  100 mg Oral BID  .  amitriptyline  25-50 mg Oral QHS  . azelastine  2 spray Each Nare BID  . DULoxetine  60 mg Oral Daily  . enoxaparin (LOVENOX) injection  40 mg Subcutaneous Daily  . famotidine  20 mg Oral Daily  . feeding supplement  1 Container Oral TID BM  . latanoprost  1 drop Both Eyes BID  . loratadine  10 mg Oral Daily  . losartan  50 mg Oral Daily  . mouth rinse  15 mL Mouth Rinse BID  . pantoprazole  40 mg Oral Daily  . pindolol  2.5 mg Oral BID  . simvastatin  40 mg Oral Daily  . timolol  1 drop Both Eyes BID    Continuous Infusions: . sodium chloride 100 mL/hr at 06/11/17 0600  . thiamine injection Stopped (06/11/17 1000)     LOS: 1 day     Annita Brod, MD [facility]  To reach me or the doctor on call, go to: [email or web address] Password [email or web address]  06/11/2017, 3:05 PM

## 2017-06-11 NOTE — Progress Notes (Signed)
Occupational Therapy Evaluation Patient Details Name: [name] MRN: [id number] DOB: [birth year] Today's Date: 06/11/2017    History of Present Illness Pt is a 67 y/o male admitted secondary to worsening cognition, gait ataxia, and double vision. Per MD notes concerning for vitamin deficiency leading to [facility]. MRI negative for acute abnormality. PMH includes HTN, gout, celiac disease, and CVA.    Clinical Impression   PTA, pt independent with ADL and mobility without an AD. Pt states he was active in the community and was the [place] at [facility]. Pt presents with functional deficits as listed below and currently requires mod A with mobility and ADL. Recommend rehab at CIR to facilitate safe DC home with wife. Will follow acutely to address establihsed goals.     Follow Up Recommendations  CIR;Supervision/Assistance - 24 hour    Equipment Recommendations  Tub/shower bench;3 in 1 bedside commode    Recommendations for Other Services Rehab consult     Precautions / Restrictions Precautions Precautions: Fall Precaution Comments: Had a fall within the past week  Restrictions Weight Bearing Restrictions: No      Mobility Bed Mobility Overal bed mobility: Needs Assistance Bed Mobility: Supine to Sit;Sit to Supine     Supine to sit: Supervision Sit to supine: Supervision   General bed mobility comments: Supervision for safety. Increased time required to perform.   Transfers Overall transfer level: Needs assistance Equipment used: None Transfers: Sit to/from [employer] Sit to Stand: Min assist Stand pivot transfers: Mod assist       General transfer comment: VC to not pull up on RW; ataixc movements increasing balance deficits    Balance Overall balance assessment: Needs assistance Sitting-balance support: No upper extremity supported;Feet supported Sitting balance-Leahy Scale: Fair     Standing balance support: Bilateral upper extremity  supported;During functional activity Standing balance-Leahy Scale: Poor Standing balance comment: Reliant on BUE support and external support for balance.                            ADL either performed or assessed with clinical judgement   ADL Overall ADL's : Needs assistance/impaired Eating/Feeding: Modified independent   Grooming: Set up;Sitting   Upper Body Bathing: Set up;Sitting   Lower Body Bathing: Minimal assistance;Sit to/from stand   Upper Body Dressing : Supervision/safety;Set up;Sitting   Lower Body Dressing: Moderate assistance;Sit to/from stand   Toilet Transfer: Moderate assistance;RW Toilet Transfer Details (indicate cue type and reason): at times due to LOB; difficulty maintaining midline at times;  Toileting- Clothing Manipulation and Hygiene: Moderate assistance Toileting - Clothing Manipulation Details (indicate cue type and reason): wife assisting     Functional mobility during ADLs: Moderate assistance;Rolling walker(LOB; difficulty with postrual control with walking backwards)       Vision Baseline Vision/History: Wears glasses Wears Glasses: At all times Patient Visual Report: Diplopia Vision Assessment?: Yes Eye Alignment: Within Functional Limits Alignment/Gaze Preference: Within Defined Limits Tracking/Visual Pursuits: Decreased smoothness of horizontal tracking Saccades: Additional eye shifts occurred during testing Convergence: Within functional limits Visual Fields: No apparent deficits Diplopia Assessment: Disappears with one eye closed;Objects split side to side;Present in far gaze;Only with left gaze Additional Comments: Will further assess; Pt reports it is best when 1 eye is closed. Attempted taping however pt reports 1 1/2 at times; will further assess     Perception Perception Comments: appears intact   Praxis Praxis Praxis tested?: Within functional limits    Pertinent Vitals/Pain  Pain Assessment: No/denies pain      Hand Dominance Right   Extremity/Trunk Assessment Upper Extremity Assessment Upper Extremity Assessment: Generalized weakness   Lower Extremity Assessment Lower Extremity Assessment: Defer to PT evaluation RLE Deficits / Details: Notable ataxia during gait.  RLE Sensation: history of peripheral neuropathy LLE Deficits / Details: Notable ataxia during gait.  LLE Sensation: history of peripheral neuropathy   Cervical / Trunk Assessment Cervical / Trunk Assessment: Normal   Communication Communication Communication: No difficulties   Cognition Arousal/Alertness: Awake/alert Behavior During Therapy: WFL for tasks assessed/performed Overall Cognitive Status: Impaired/Different from baseline Area of Impairment: Memory;Attention                   Current Attention Level: Selective Memory: Decreased short-term memory         General Comments: Pt's daughter reports deficits with memory that have worsened recently.  Will further assess.   General Comments       Exercises     Shoulder Instructions      Home Living Family/patient expects to be discharged to:: Private residence Living Arrangements: Spouse/significant other Available Help at Discharge: Family;Available 24 hours/day Type of Home: House Home Access: Stairs to enter [employer] of Steps: 1 Entrance Stairs-Rails: None Home Layout: Two level Alternate Level Stairs-Number of Steps: flight  Alternate Level Stairs-Rails: Right Bathroom Shower/Tub: Teacher, early years/pre: Standard Bathroom Accessibility: Yes How Accessible: Accessible via walker Home Equipment: Cane - single point          Prior Functioning/Environment Level of Independence: Independent        Comments: Unsteady with gait and has had a fall within the past week. Previously was independent without any deficits. Was the [place] [facility] [place].        OT Problem List: Decreased strength;Decreased  activity tolerance;Impaired balance (sitting and/or standing);Impaired vision/perception;Decreased coordination;Decreased cognition;Decreased safety awareness;Decreased knowledge of use of DME or AE;Obesity      OT Treatment/Interventions: Self-care/ADL training;Therapeutic exercise;Neuromuscular education;DME and/or AE instruction;Therapeutic activities;Cognitive remediation/compensation;Visual/perceptual remediation/compensation;Patient/family education;Balance training    OT Goals(Current goals can be found in the care plan section) Acute Rehab OT Goals Patient Stated Goal: to get better  OT Goal Formulation: With patient/family Time For Goal Achievement: 06/25/17 Potential to Achieve Goals: Good  OT Frequency: Min 2X/week   Barriers to D/C:            Co-evaluation              AM-PAC PT "6 Clicks" Daily Activity     Outcome Measure Help from another person eating meals?: None Help from another person taking care of personal grooming?: A Little Help from another person toileting, which includes using toliet, bedpan, or urinal?: A Little Help from another person bathing (including washing, rinsing, drying)?: A Little Help from another person to put on and taking off regular upper body clothing?: A Little Help from another person to put on and taking off regular lower body clothing?: A Lot 6 Click Score: 18   End of Session Equipment Utilized During Treatment: Gait belt;Rolling walker Nurse Communication: Mobility status;Precautions  Activity Tolerance: Patient tolerated treatment well Patient left: in bed;with call bell/phone within reach;with family/visitor present  OT Visit Diagnosis: Other abnormalities of gait and mobility (R26.89);Muscle weakness (generalized) (M62.81);History of falling (Z91.81);Low vision, both eyes (H54.2);Other symptoms and signs involving cognitive function;Dizziness and giddiness (R42)                Time: 7253-6644 OT Time Calculation (  min):  26 min Charges:  OT General Charges $OT Visit: 1 Visit OT Evaluation $OT Eval Moderate Complexity: 1 Mod OT Treatments $Self Care/Home Management : 8-22 mins G-Codes:     [facility], OT/L  [phone] 06/11/2017  Katelyn Broadnax,HILLARY 06/11/2017, 10:19 AM

## 2017-06-11 NOTE — Progress Notes (Signed)
I met with patient and his wife at bedside to discuss goals and expectations of an inpt rehab admit. They are in agreement, but wife has concerns about his GI issues that let to this hospitalization. I assured them that I would contact Dr. Maryland Pink to share their concerns and that we would await medical work up completion before pursuing CIR admit. I contacted Dr. Maryland Pink to convey their concerns. He will address today and we will follow up tomorrow for possible admit if work up complete. [phone]

## 2017-06-12 ENCOUNTER — Other Ambulatory Visit: Payer: Self-pay

## 2017-06-12 LAB — BASIC METABOLIC PANEL
Anion gap: 9 (ref 5–15)
BUN: 14 mg/dL (ref 6–20)
CALCIUM: 9 mg/dL (ref 8.9–10.3)
CO2: 23 mmol/L (ref 22–32)
CREATININE: 1 mg/dL (ref 0.61–1.24)
Chloride: 110 mmol/L (ref 101–111)
GFR calc Af Amer: >60 mL/min (ref >60)
GFR calc non Af Amer: >60 mL/min (ref >60)
GLUCOSE: 102 mg/dL — AB (ref 65–99)
Potassium: 4 mmol/L (ref 3.5–5.1)
SODIUM: 142 mmol/L (ref 135–145)

## 2017-06-12 LAB — GLUCOSE, CAPILLARY
Glucose-Capillary: 94 mg/dL (ref 65–99)
Glucose-Capillary: 99 mg/dL (ref 65–99)

## 2017-06-12 MED ORDER — DILTIAZEM HCL 30 MG PO TABS
30.0000 mg | ORAL_TABLET | Freq: Four times a day (QID) | ORAL | Status: DC
  Administered 2017-06-12: 30 mg via ORAL
  Filled 2017-06-12 (×2): qty 1

## 2017-06-12 MED ORDER — METOPROLOL TARTRATE 5 MG/5ML IV SOLN
2.5000 mg | Freq: Once | INTRAVENOUS | Status: AC
  Administered 2017-06-12: 2.5 mg via INTRAVENOUS
  Filled 2017-06-12: qty 5

## 2017-06-12 MED ORDER — ADENOSINE 6 MG/2ML IV SOLN: 6.0000 mg | Freq: Once | INTRAVENOUS | Status: DC

## 2017-06-12 NOTE — Care Management Note (Signed)
Case Management Note  Patient Details  Name: CAMERON SCHWINN MRN: [id number] Date of Birth: [birth year]  Subjective/Objective:                    Action/Plan: Daughter requesting patient go to [facility] and not to CIR. CM notified CSW and Pamala Hurry with CIR. MD also updated and made aware that daughter has a lot of questions about medications at d/c and his malabsorption.  CM following.  Expected Discharge Date:  06/11/17               Expected Discharge Plan:  Skilled Nursing Facility  In-House Referral:  Clinical Social Work  Discharge planning Services  CM Consult  Post Acute Care Choice:    Choice offered to:     DME Arranged:    DME Agency:     HH Arranged:    [place] Agency:     Status of Service:  In process, will continue to follow  If discussed at Long Length of Stay Meetings, dates discussed:    Additional Comments:  Pollie Friar, RN 06/12/2017, 11:42 AM

## 2017-06-12 NOTE — Progress Notes (Signed)
Notified by RN CM that family requesting [facility] SNF for rehab. We will sign off at this time. [phone]

## 2017-06-12 NOTE — Progress Notes (Signed)
Physical Therapy Treatment Patient Details Name: [name] MRN: [id number] DOB: [birth year] Today's Date: 06/12/2017    History of Present Illness Pt is a 67 y/o male admitted secondary to worsening cognition, gait ataxia, and double vision. Per MD notes concerning for Wernickes. MRI negative for acute abnormality. PMH includes HTN, gout, celiac disease, and CVA.     PT Comments    Patient continues to present with impaired balance and cognition. Pt required min/mod A for STS and gait training. Pt with short term memory deficits and impaired vision. L eye covered while ambulating which pt reported helps "a little" with double vision. Pt would like to go to [facility] in [place] for post acute rehab if possible. Continue to progress as tolerated.    Follow Up Recommendations  CIR     Equipment Recommendations  Rolling walker with 5" wheels    Recommendations for Other Services OT consult     Precautions / Restrictions Precautions Precautions: Fall Precaution Comments: Had a fall within the past week  Restrictions Weight Bearing Restrictions: No    Mobility  Bed Mobility Overal bed mobility: Needs Assistance Bed Mobility: Supine to Sit;Sit to Supine     Supine to sit: Supervision     General bed mobility comments: Supervision for safety   Transfers Overall transfer level: Needs assistance Equipment used: None Transfers: Sit to/from Stand Sit to Stand: Min assist         General transfer comment: assist for balance upon standing  Ambulation/Gait Ambulation/Gait assistance: Min assist;Mod assist Ambulation Distance (Feet): 200 Feet Assistive device: None Gait Pattern/deviations: Step-through pattern;Decreased stride length;Ataxic;Staggering left;Staggering right Gait velocity: Decreased    General Gait Details: assistance required for balance; pt unable to tolerate horizontal or vertical head turns without significant gait deviations and LOB   Stairs              Wheelchair Mobility    Modified Rankin (Stroke Patients Only)       Balance Overall balance assessment: Needs assistance Sitting-balance support: No upper extremity supported;Feet supported Sitting balance-Leahy Scale: Fair     Standing balance support: Bilateral upper extremity supported;During functional activity Standing balance-Leahy Scale: Poor                              Cognition Arousal/Alertness: Awake/alert Behavior During Therapy: WFL for tasks assessed/performed Overall Cognitive Status: Impaired/Different from baseline Area of Impairment: Memory                   Current Attention Level: Selective Memory: Decreased short-term memory         General Comments: Pt's daughter reports deficits with memory that have worsened recently.  Will further assess.      Exercises      General Comments General comments (skin integrity, edema, etc.): pt's HR up to 147       Pertinent Vitals/Pain Pain Assessment: No/denies pain    Home Living                      Prior Function            PT Goals (current goals can now be found in the care plan section) Acute Rehab PT Goals Patient Stated Goal: to get better  PT Goal Formulation: With patient Time For Goal Achievement: 06/24/17 Potential to Achieve Goals: Good Progress towards PT goals: Progressing toward goals    Frequency  Min 3X/week      PT Plan Current plan remains appropriate    Co-evaluation              AM-PAC PT "6 Clicks" Daily Activity  Outcome Measure  Difficulty turning over in bed (including adjusting bedclothes, sheets and blankets)?: A Little Difficulty moving from lying on back to sitting on the side of the bed? : A Little Difficulty sitting down on and standing up from a chair with arms (e.g., wheelchair, bedside commode, etc,.)?: Unable Help needed moving to and from a bed to chair (including a wheelchair)?: A Lot Help  needed walking in hospital room?: A Lot Help needed climbing 3-5 steps with a railing? : A Little 6 Click Score: 14    End of Session Equipment Utilized During Treatment: Gait belt Activity Tolerance: Patient tolerated treatment well Patient left: with call bell/phone within reach;in bed;with bed alarm set Nurse Communication: Mobility status PT Visit Diagnosis: Unsteadiness on feet (R26.81);History of falling (Z91.81);Ataxic gait (R26.0);Other symptoms and signs involving the nervous system (R29.898);Difficulty in walking, not elsewhere classified (R26.2)     Time: 0258-5277 PT Time Calculation (min) (ACUTE ONLY): 25 min  Charges:  $Gait Training: 23-37 mins                    G Codes:       Earney Navy, PTA Pager: [phone]     Darliss Cheney 06/12/2017, 10:22 AM

## 2017-06-12 NOTE — Clinical Social Work Note (Signed)
Clinical Social Work Assessment  Patient Details  Name: [name] MRN: [id number] Date of Birth: [birth year]  Date of referral:  06/12/17               Reason for consult:  Facility Placement                Permission sought to share information with:  Family Supports Permission granted to share information::  Yes, Release of Information Signed  Name::     Airline pilot::  [place]  Relationship::  Spouse  Contact Information:     Housing/Transportation Living arrangements for the past 2 months:  Single Family Home Source of Information:  Patient Patient Interpreter Needed:  None Criminal Activity/Legal Involvement Pertinent to Current Situation/Hospitalization:  No - Comment as needed Significant Relationships:  Spouse Lives with:  Spouse Do you feel safe going back to the place where you live?  No Need for family participation in patient care:  No (Coment)  Care giving concerns:  Pt is alert and oriented. Pt states he was independent prior to admission, no caregiver noted.    Social Worker assessment / plan:  CSW met with pt at bedside. Recommendation was for CIR however pt wants to go to [facility]. Pt's spouse has previously spoken to [facility] and they will have a bed for pt. CSW to follow up with facility. Awaiting PASRR number.   Employment status:  Retired Forensic scientist:  Medicare PT Recommendations:  [place] / Referral to community resources:  [place]  Patient/Family's Response to care:  Pt verbalized understanding of CSW role and expressed appreciation for support. Pt denies any concern regarding pt care at this time.   Patient/Family's Understanding of and Emotional Response to Diagnosis, Current Treatment, and Prognosis:  Pt understanding and realistic regarding physical limitations. Pt understands the need for SNF placement at d/c. Pt agreeable to SNF placement at d/c, at this time. Pt's responses  emotionally appropriate during conversation with CSW. Pt denies any concern regarding treatment plan at this time. CSW will continue to provide support and facilitate d/c needs.   Emotional Assessment Appearance:  Appears stated age Attitude/Demeanor/Rapport:  (Patient was appropriate) Affect (typically observed):  Accepting, Appropriate, Calm Orientation:  Oriented to Self, Oriented to  Time, Oriented to Situation, Oriented to Place Alcohol / Substance use:  Not Applicable Psych involvement (Current and /or in the community):  No (Comment)  Discharge Needs  Concerns to be addressed:  No discharge needs identified Readmission within the last 30 days:  No Current discharge risk:  Dependent with Mobility Barriers to Discharge:  Awaiting State Approval ([place])   Eileen Stanford, LCSW 06/12/2017, 1:27 PM

## 2017-06-12 NOTE — Progress Notes (Signed)
After metoprolol administered, recheck BP 91/74, HR 121. MD paged.

## 2017-06-12 NOTE — Consult Note (Signed)
            [facility] CM Primary Care Navigator  06/12/2017  [name] 1950-07-18 [id number]  Went to see patient at the bedside to identify possible discharge needs. Patient was admitted for double vision, poor balance, nausea and vomiting which ledto this admission.   Patient endorses Dr.Elizabeth Dewey with Rachell Cipro, MD clinic astheprimary care provider.   Patientshares using [employer], [employer] and [facility] Order service to obtain medications withoutdifficulty.  Patienthas beenmanaginghis ownmedications at homeusing "pill box"system filled once weekly.  Patient has been driving prior to admission but wife [name]) willprovidetransportation to hisdoctors'appointments after discharge.  Patient reports that he stays active and is physically independent prior to admission. His wife will be the primary caregiverat homewhen discharged.  Per Inpatient CM note, daughter requesting patient go to [facility] skilled nursing facility for rehabilitation and not to CIR [facility]).  Patientvoiced understanding to callprimary care provider's office when hereturnsbackhome,to schedulea post discharge follow-upwithin1- 2 weeksor sooner if needed. Patient letter (with PCP's contact number) was provided as areminder.    Explained topatientregardingTHN CM services available for health managementat home but he denies any needs or concerns at this point. Patient expressed understandingto seek referral from primary care provider to [facility] care managementifdeemed necessary and appropriatefor servicesin the future- (once hereturns back home).  [facility] care management information provided for future needs that may arise.   For additional questions please contact:  Edwena Felty A. Trejuan Matherne, BSN, RN-BC [facility] PRIMARY CARE Navigator Cell: [phone]

## 2017-06-12 NOTE — Progress Notes (Signed)
PROGRESS NOTE    [name]  QJJ:[id number] DOB: [birth year] DOA: 06/09/2017 PCP: [name], MD   Brief Narrative:   67 year old male with past medical history of hypertension, obesity status post gastric bypass 10 years prior who in the past week has had progressively worsening issues with confusion, difficulty walking, numbness in lower extremities, double vision and nausea/vomiting. Came into the emergency room for further evaluation.  Recent MRI negative.  Seen by neurology who felt this possibly could be vitamin deficiency leading to Warnicke's encephalopathy.  Repeat MRI negative.  Patient started on vitamin replacement with B12 and thiamine.  He still remains somewhat confused but is overall improved. Labs unremarkable.  He has been noted to have some tachycardia with narrow QRS complex.  He appears to be an adequate antiarrhythmic therapy with borderline soft blood pressure readings.  He is currently awaiting transfer to skilled nursing facility instead of CIR per family request.   Assessment & Plan:   Principal Problem:   Double vision Active Problems:   Benign essential HTN   Gout   Depression   Nausea & vomiting   GERD (gastroesophageal reflux disease)   HLD (hyperlipidemia)   Wernicke encephalopathy   1. Acute encephalopathy likely secondary to Wernicke with vitamin deficiency.  This is in setting of prior gastric bypass surgery and has likely led to poor vitamin absorption.  He has been started on parenteral B12 supplementation plus high-dose thiamine for 3 days with plans to go to skilled nursing facility in a.m. 2. Hypertension.  Blood pressure readings currently soft with associated junctional tachycardia, but this has been improving.  Continue home pindolol as well as other medications for blood pressure control.  He was given Cardizem earlier today and his heart rate has subsequently improved. 3. Gout.  No acute flare currently noted. 4. Nausea and  vomiting secondary to Warnicke's encephalopathy.  Appears to be resolving and patient is now tolerating diet. 5. GERD.  Continue Protonix. 6. Dyslipidemia. 7. Sleep apnea.  Continue home CPAP.  DVT prophylaxis: Lovenox Code Status: Full Family Communication: Spoke with wife [name] on phone Disposition Plan: Anticipate discharge in a.m. to skilled nursing facility   Consultants:   Neurology  Inpatient rehab  Procedures:   None  Antimicrobials:   None   Subjective: Patient seen and evaluated today with no new acute complaints or concerns. No acute concerns or events noted overnight.  He denies any chest pain, palpitations, or shortness of breath.  Objective: Vitals:   06/12/17 0003 06/12/17 0405 06/12/17 0653 06/12/17 0738  BP: 111/64 109/64 91/74 111/71  Pulse: (!) 58 (!) 56 (!) 121 (!) 124  Resp: 18 18  17   Temp: 98 F (36.7 C) 97.9 F (36.6 C)  (!) 97.5 F (36.4 C)  TempSrc: Oral Oral  Oral  SpO2: 100% 99% 93% 97%  Weight:  115.2 kg (253 lb 15.5 oz)    Height:        Intake/Output Summary (Last 24 hours) at 06/12/2017 1503 Last data filed at 06/12/2017 0656 Gross per 24 hour  Intake -  Output 2950 ml  Net -2950 ml   Filed Weights   06/10/17 0632 06/12/17 0405  Weight: 110.7 kg (244 lb 0.8 oz) 115.2 kg (253 lb 15.5 oz)    Examination:  General exam: Appears calm and comfortable  Respiratory system: Clear to auscultation. Respiratory effort normal. Cardiovascular system: S1 & S2 heard, RRR. No JVD, murmurs, rubs, gallops or clicks. No pedal edema. Gastrointestinal system: Abdomen  is nondistended, soft and nontender. No organomegaly or masses felt. Normal bowel sounds heard. Central nervous system: Alert and oriented. No focal neurological deficits. Extremities: Symmetric 5 x 5 power. Skin: No rashes, lesions or ulcers Psychiatry: Judgement and insight appear normal. Mood & affect appropriate.     Data Reviewed: I have personally reviewed following  labs and imaging studies  CBC: Recent Labs  Lab 06/09/17 1225 06/09/17 1644 06/10/17 0458  WBC 8.4  --  8.7  HGB 15.1 15.6 12.9*  HCT 42.8 46.0 37.1*  MCV 87.2  --  86.7  PLT 189  --  503   Basic Metabolic Panel: Recent Labs  Lab 06/09/17 1225 06/09/17 1644 06/10/17 0458 06/12/17 0659  NA 134* 135 135 142  K 4.3 4.5 3.7 4.0  CL 99* 97* 103 110  CO2 24  --  22 23  GLUCOSE 177* 177* 121* 102*  BUN 10 13 14 14   CREATININE 1.14 0.80 0.94 1.00  CALCIUM 9.9  --  9.0 9.0   GFR: Estimated Creatinine Clearance: 91.3 mL/min (by C-G formula based on SCr of 1 mg/dL). Liver Function Tests: Recent Labs  Lab 06/09/17 1225  AST 20  ALT 15*  ALKPHOS 88  BILITOT 0.9  PROT 6.9  ALBUMIN 4.2   Recent Labs  Lab 06/09/17 1225  LIPASE 31   Recent Labs  Lab 06/09/17 1707  AMMONIA 15   Coagulation Profile: No results for input(s): INR, PROTIME in the last 168 hours. Cardiac Enzymes: No results for input(s): CKTOTAL, CKMB, CKMBINDEX, TROPONINI in the last 168 hours. BNP (last 3 results) No results for input(s): PROBNP in the last 8760 hours. HbA1C: No results for input(s): HGBA1C in the last 72 hours. CBG: Recent Labs  Lab 06/09/17 1247 06/10/17 0608 06/11/17 0628 06/12/17 0613 06/12/17 0736  GLUCAP 160* 100* 88 94 99   Lipid Profile: No results for input(s): CHOL, HDL, LDLCALC, TRIG, CHOLHDL, LDLDIRECT in the last 72 hours. Thyroid Function Tests: No results for input(s): TSH, T4TOTAL, FREET4, T3FREE, THYROIDAB in the last 72 hours. Anemia Panel: Recent Labs    06/09/17 2050  VITAMINB12 352  FOLATE 9.1   Sepsis Labs: No results for input(s): PROCALCITON, LATICACIDVEN in the last 168 hours.  No results found for this or any previous visit (from the past 240 hour(s)).       Radiology Studies: No results found.      Scheduled Meds: . allopurinol  100 mg Oral BID  . amitriptyline  25-50 mg Oral QHS  . azelastine  2 spray Each Nare BID  .  DULoxetine  60 mg Oral Daily  . enoxaparin (LOVENOX) injection  40 mg Subcutaneous Daily  . famotidine  20 mg Oral Daily  . feeding supplement  1 Container Oral TID BM  . latanoprost  1 drop Both Eyes BID  . loratadine  10 mg Oral Daily  . losartan  50 mg Oral Daily  . mouth rinse  15 mL Mouth Rinse BID  . pantoprazole  40 mg Oral Daily  . pindolol  2.5 mg Oral BID  . simvastatin  40 mg Oral Daily  . timolol  1 drop Both Eyes BID   Continuous Infusions: . sodium chloride 50 mL/hr at 06/11/17 1654  . thiamine injection Stopped (06/12/17 1049)     LOS: 2 days    Time spent: 30 minutes    Trinisha Paget Darleen Crocker, DO [facility] Pager [phone]  If 7PM-7AM, please contact night-coverage [email or web address] Password [place] 06/12/2017, 3:03  PM

## 2017-06-12 NOTE — NC FL2 (Addendum)
[place] LEVEL OF CARE SCREENING TOOL     IDENTIFICATION  Patient Name: [name] Birthdate: 23-May-1950 Sex: male Admission Date (Current Location): 06/09/2017  [facility] and [place] Number:  Herbalist and Address:  [facility] [place], [place] [address], [place], [place] [address]      Provider Number: [id number]  Attending Physician Name and Address:  Rodena Goldmann, DO  Relative Name and Phone Number:       Current Level of Care: Hospital Recommended Level of Care: [place] Prior Approval Number:    Date Approved/Denied:   PASRR Number: [id number]  Expiration date: 07/15/2017   Discharge Plan: SNF    Current Diagnoses: Patient Active Problem List   Diagnosis Date Noted  . GERD (gastroesophageal reflux disease) 06/10/2017  . HLD (hyperlipidemia) 06/10/2017  . Wernicke encephalopathy 06/10/2017  . Nausea & vomiting 06/09/2017  . Double vision 06/09/2017  . Gout   . Depression   . Discoloration of skin of toe 03/18/2016  . Tachycardia 03/02/2015  . Bradycardia 08/03/2014  . Palpitations 06/26/2014  . Benign essential HTN 06/26/2014  . Fatigue 06/26/2014    Orientation RESPIRATION BLADDER Height & Weight     Self, Time, Situation, Place  Normal Continent Weight: 253 lb 15.5 oz (115.2 kg) Height:  5\' 11"  (180.3 cm)  BEHAVIORAL SYMPTOMS/MOOD NEUROLOGICAL BOWEL NUTRITION STATUS      Continent Diet(Soft diet, thin liquids)  AMBULATORY STATUS COMMUNICATION OF NEEDS Skin   Limited Assist Verbally Normal                       Personal Care Assistance Level of Assistance  Bathing, Dressing, Feeding Bathing Assistance: Limited assistance Feeding assistance: Independent Dressing Assistance: Limited assistance     Functional Limitations Info  Sight, Hearing, Speech Sight Info: Adequate Hearing Info: Adequate Speech Info: Adequate    SPECIAL CARE FACTORS FREQUENCY  PT (By licensed PT), OT (By  licensed OT)     PT Frequency: 3x OT Frequency: 3x            Contractures Contractures Info: Not present    Additional Factors Info  Code Status, Allergies Code Status Info: Full Allergies Info: Gabapentin, Gluten Meal, Lactose           Current Medications (06/12/2017):  This is the current hospital active medication list Current Facility-Administered Medications  Medication Dose Route Frequency Provider Last Rate Last Dose  . 0.9 %  sodium chloride infusion   Intravenous Continuous Annita Brod, MD 50 mL/hr at 06/11/17 1654    . acetaminophen (TYLENOL) tablet 650 mg  650 mg Oral Q6H PRN Ivor Costa, MD   650 mg at 06/10/17 1700   Or  . acetaminophen (TYLENOL) suppository 650 mg  650 mg Rectal Q6H PRN Ivor Costa, MD      . allopurinol (ZYLOPRIM) tablet 100 mg  100 mg Oral BID Ivor Costa, MD   100 mg at 06/12/17 0932  . amitriptyline (ELAVIL) tablet 25-50 mg  25-50 mg Oral QHS Ivor Costa, MD   25 mg at 06/11/17 2121  . azelastine (ASTELIN) 0.1 % nasal spray 2 spray  2 spray Each Nare BID Ivor Costa, MD   2 spray at 06/12/17 [phone]  . diltiazem (CARDIZEM) tablet 30 mg  30 mg Oral Q6H Shah, Pratik D, DO      . DULoxetine (CYMBALTA) DR capsule 60 mg  60 mg Oral Daily Ivor Costa, MD  60 mg at 06/12/17 0922  . enoxaparin (LOVENOX) injection 40 mg  40 mg Subcutaneous Daily Ivor Costa, MD   40 mg at 06/12/17 4665  . famotidine (PEPCID) tablet 20 mg  20 mg Oral Daily Ivor Costa, MD   20 mg at 06/12/17 9935  . feeding supplement (BOOST / RESOURCE BREEZE) liquid 1 Container  1 Container Oral TID BM Annita Brod, MD   1 Container at 06/11/17 2120  . hydrALAZINE (APRESOLINE) injection 5 mg  5 mg Intravenous Q2H PRN Ivor Costa, MD      . latanoprost (XALATAN) 0.005 % ophthalmic solution 1 drop  1 drop Both Eyes BID Ivor Costa, MD   1 drop at 06/12/17 [phone]  . loratadine (CLARITIN) tablet 10 mg  10 mg Oral Daily Ivor Costa, MD   10 mg at 06/12/17 7939  . losartan (COZAAR) tablet 50  mg  50 mg Oral Daily Ivor Costa, MD   50 mg at 06/12/17 0300  . MEDLINE mouth rinse  15 mL Mouth Rinse BID Ivor Costa, MD   15 mL at 06/11/17 2125  . ondansetron (ZOFRAN) injection 4 mg  4 mg Intravenous Q8H PRN Ivor Costa, MD      . pantoprazole (PROTONIX) EC tablet 40 mg  40 mg Oral Daily Ivor Costa, MD   40 mg at 06/12/17 9233  . pindolol (VISKEN) tablet 2.5 mg  2.5 mg Oral BID Ivor Costa, MD   2.5 mg at 06/12/17 0076  . polyethylene glycol (MIRALAX / GLYCOLAX) packet 17 g  17 g Oral Daily PRN Ivor Costa, MD      . simvastatin (ZOCOR) tablet 40 mg  40 mg Oral Daily Ivor Costa, MD   40 mg at 06/12/17 2263  . thiamine 500mg  in normal saline (26ml) IVPB  500 mg Intravenous TID Greta Doom, MD 100 mL/hr at 06/12/17 1019 500 mg at 06/12/17 1019  . timolol (TIMOPTIC) 0.5 % ophthalmic solution 1 drop  1 drop Both Eyes BID Ivor Costa, MD   1 drop at 06/12/17 3354  . zolpidem (AMBIEN) tablet 5 mg  5 mg Oral QHS PRN Ivor Costa, MD         Discharge Medications: Please see discharge summary for a list of discharge medications.  Relevant Imaging Results:  Relevant Lab Results:   Additional Information SSN: [id number]  Eileen Stanford, LCSW

## 2017-06-12 NOTE — Progress Notes (Signed)
HR in 160 down to 130. Pt feels like "heart racing." Denies SOB and chest pain. NP notified with order of Metoprolol.

## 2017-06-12 NOTE — Clinical Social Work Note (Addendum)
Pt's PASRR is under review. Pt will be unable to go to d/c SNF until PASRR is obtained.  [place], [place]

## 2017-06-13 DIAGNOSIS — E512 Wernicke's encephalopathy: Principal | ICD-10-CM

## 2017-06-13 DIAGNOSIS — E785 Hyperlipidemia, unspecified: Secondary | ICD-10-CM

## 2017-06-13 DIAGNOSIS — F329 Major depressive disorder, single episode, unspecified: Secondary | ICD-10-CM

## 2017-06-13 DIAGNOSIS — K219 Gastro-esophageal reflux disease without esophagitis: Secondary | ICD-10-CM

## 2017-06-13 DIAGNOSIS — R112 Nausea with vomiting, unspecified: Secondary | ICD-10-CM

## 2017-06-13 DIAGNOSIS — M109 Gout, unspecified: Secondary | ICD-10-CM

## 2017-06-13 DIAGNOSIS — I10 Essential (primary) hypertension: Secondary | ICD-10-CM

## 2017-06-13 LAB — CBC
HEMATOCRIT: 37.5 % — AB (ref 39.0–52.0)
Hemoglobin: 12.8 g/dL — ABNORMAL LOW (ref 13.0–17.0)
MCH: 30.5 pg (ref 26.0–34.0)
MCHC: 34.1 g/dL (ref 30.0–36.0)
MCV: 89.5 fL (ref 78.0–100.0)
Platelets: 166 10*3/uL (ref 150–400)
RBC: 4.19 MIL/uL — ABNORMAL LOW (ref 4.22–5.81)
RDW: 13.8 % (ref 11.5–15.5)
WBC: 6 10*3/uL (ref 4.0–10.5)

## 2017-06-13 LAB — GLUCOSE, CAPILLARY: Glucose-Capillary: 88 mg/dL (ref 65–99)

## 2017-06-13 MED ORDER — CYANOCOBALAMIN 1000 MCG/ML IJ SOLN
1000.0000 ug | Freq: Every day | INTRAMUSCULAR | Status: DC
  Administered 2017-06-13 – 2017-06-15 (×3): 1000 ug via INTRAMUSCULAR
  Filled 2017-06-13 (×3): qty 1

## 2017-06-13 MED ORDER — THIAMINE HCL 100 MG/ML IJ SOLN
500.0000 mg | Freq: Every day | INTRAVENOUS | Status: DC
  Administered 2017-06-13 – 2017-06-15 (×3): 500 mg via INTRAVENOUS
  Filled 2017-06-13 (×3): qty 5

## 2017-06-13 NOTE — Clinical Social Work Note (Signed)
Unable to get PASRR 4/19 due to holiday. Unable to get PASRR on weekends. Pt will be unable to go to SNF until PASRR is received.   [place], [place]

## 2017-06-13 NOTE — Clinical Social Work Note (Signed)
CSW spoke with pt's wife. Pt's wife concerned that pt will loose his bed at [facility] due to awaiting PASRR (Monday). CSW spoke with Melissa Montane at [facility] and she assured CSW that pt will have his bed on Monday. CSW relayed this information to wife--pt's wife relieved and appreciative.   [place], [place]

## 2017-06-13 NOTE — Progress Notes (Signed)
PROGRESS NOTE    [name]  XBJ:[id number] DOB: [birth year] DOA: 06/09/2017 PCP: [name], MD   Brief Narrative:  HPI on 06/09/2017 by Dr. Ivor Costa [name] is a 67 y.o. male with medical history significant of hypertension, hyperlipidemia, GERD, gout, depression, peptic ulcer disease, BPH, prostate cancer (surgery, no radiation or chemotherapy), celiac disease, who presents with double vision, poor balance, nausea, vomiting.  Pt states that he has been having double vision in both eyes for more than 2 weeks. He also has decreased memory, dizziness, poor balance recently.Patient states that he has a chronic peripheral neuropathy in both feet, but no unilateral weakness in extremities. No facial droop, slurred speech. He has mild bilateral hearing loss recently. Pt was seen by opthalmologist, then seen here for MRI. MRI on 4/9 was negative.   Patient also reports nausea and vomiting, which has been going on for more than one week. He vomited 5 times today, no blood in the vomitus. He has poor appetite and decreased oral intake. No abdominal pain. His last bowel movement was yesterday. He states that he is not constipated. He was seen by Dr. [name] of GI on Wed last week and had endoscopy on Thursday of last week, but could not complete procedure due to too much food in pts stomach. Patient does not have chest pain, shortness breath, cough, fever or chills. No symptoms of UTI. Pt was seen by his PCP today and sent to ED for further evaluation and treatment.   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 [name], will continue thiamine 500 mg IV daily, transition to intramuscular upon discharge along with B12 injections -PT and OT recommended CIR however patient and wife would like him to be at nursing facility close to home -Social work consulted and appreciated -Pending PASRR  Double vision -Possibly secondary to the above  Hypertension -BP has been somewhat soft and associated with junctional tachycardia however been improving -Continue pindolol -Patient was given Cardizem during this admission, heart rate appears to have improved  Nausea/vomiting -Resolved, currently tolerating diet -Suspected to be secondary to the above -CT abdomen pelvis showed no acute abdominopelvic findings -Continue antiemetics as needed  Obstructive sleep apnea -Continue CPAP nightly  Hyperlipidemia -Continue statin  Gout -Stable, Continue allopurinol  Depression -Stable, continue Cymbalta, amitriptyline  GERD -Continue PPI/H2 blockers  DVT Prophylaxis Lovenox  Code Status: Full  Family Communication: None at bedside.  Wife via phone  Disposition Plan: Admitted.  Pending SNF, likely on 06/15/2017  Consultants Neurology Inpatient rehab  Procedures  None  Antibiotics   Anti-infectives (From admission, onward)   None      Subjective:   [name] seen and examined today.  Patient worries about his memory loss, states his mother and grandmother had dementia.  Currently denies chest pain, shortness of breath, abdominal pain, nausea vomiting, diarrhea or constipation.  Objective:   Vitals:   06/12/17 2009 06/12/17 2344 06/13/17 0336 06/13/17 0834  BP: 112/71 120/68 109/69 100/66  Pulse: 63 62 (!) 56 (!) 54  Resp: 18 18 18 15   Temp: 99 F (37.2 C) 98.7 F (37.1 C) 97.7 F (36.5 C) 97.7 F (36.5 C)  TempSrc:  Oral Axillary Oral Oral  SpO2: 97% 98% 99% 100%  Weight:      Height:        Intake/Output Summary (Last 24 hours) at 06/13/2017 1406 Last data filed at 06/12/2017 2344 Gross per 24 hour  Intake -  Output 700 ml  Net -700 ml   Filed Weights   06/10/17 5621 06/12/17 0405  Weight: 110.7 kg (244 lb 0.8 oz) 115.2 kg (253 lb 15.5 oz)    Exam  General: Well developed, well nourished, NAD, appears stated age  21: NCAT,mucous membranes moist.   Neck: Supple  Cardiovascular: S1 S2 auscultated, no rubs, murmurs or gallops. Regular rate and rhythm.  Respiratory: Clear to auscultation bilaterally with equal chest rise  Abdomen: Soft, nontender, nondistended, + bowel sounds  Extremities: warm dry without cyanosis clubbing or edema  Neuro: AAOx3, nonfocal  Psych: Normal affect and demeanor with intact judgement and insight   Data Reviewed: I have personally reviewed following labs and imaging studies  CBC: Recent Labs  Lab 06/09/17 1225 06/09/17 1644 06/10/17 0458 06/13/17 0516  WBC 8.4  --  8.7 6.0  HGB 15.1 15.6 12.9* 12.8*  HCT 42.8 46.0 37.1* 37.5*  MCV 87.2  --  86.7 89.5  PLT 189  --  169 308   Basic Metabolic Panel: Recent Labs  Lab 06/09/17 1225 06/09/17 1644 06/10/17 0458 06/12/17 0659  NA 134* 135 135 142  K 4.3 4.5 3.7 4.0  CL 99* 97* 103 110  CO2 24  --  22 23  GLUCOSE 177* 177* 121* 102*  BUN 10 13 14 14   CREATININE 1.14 0.80 0.94 1.00  CALCIUM 9.9  --  9.0 9.0   GFR: Estimated Creatinine Clearance: 91.3 mL/min (by C-G formula based on SCr of 1 mg/dL). Liver Function Tests: Recent Labs  Lab 06/09/17 1225  AST 20  ALT 15*  ALKPHOS 88  BILITOT 0.9  PROT 6.9  ALBUMIN 4.2   Recent Labs  Lab 06/09/17 1225  LIPASE 31   Recent Labs  Lab 06/09/17 1707  AMMONIA 15   Coagulation Profile: No results for input(s): INR, PROTIME in the last 168 hours. Cardiac Enzymes: No results for input(s): CKTOTAL, CKMB, CKMBINDEX, TROPONINI in the  last 168 hours. BNP (last 3 results) No results for input(s): PROBNP in the last 8760 hours. HbA1C: No results for input(s): HGBA1C in the last 72 hours. CBG: Recent Labs  Lab 06/10/17 0608 06/11/17 0628 06/12/17 0613 06/12/17 0736 06/13/17 0614  GLUCAP 100* 88 94 99 88   Lipid Profile: No results for input(s): CHOL, HDL, LDLCALC, TRIG, CHOLHDL, LDLDIRECT in the last 72 hours. Thyroid Function Tests: No results for input(s): TSH, T4TOTAL, FREET4, T3FREE, THYROIDAB in the last 72 hours. Anemia Panel: No results for input(s): VITAMINB12, FOLATE, FERRITIN, TIBC, IRON, RETICCTPCT in the last 72 hours. Urine analysis:    Component Value Date/Time   COLORURINE YELLOW 06/09/2017 [place] 06/09/2017 1413   LABSPEC 1.025 06/09/2017 1413   PHURINE 8.0 06/09/2017 1413   GLUCOSEU NEGATIVE 06/09/2017 1413   HGBUR NEGATIVE 06/09/2017 1413   BILIRUBINUR NEGATIVE 06/09/2017 1413   KETONESUR 80 (A) 06/09/2017 1413   PROTEINUR NEGATIVE 06/09/2017 1413   UROBILINOGEN 0.2 04/19/2007 1436   NITRITE NEGATIVE 06/09/2017 1413   LEUKOCYTESUR NEGATIVE 06/09/2017 1413   Sepsis Labs: @LABRCNTIP (procalcitonin:4,lacticidven:4)  )No  results found for this or any previous visit (from the past 240 hour(s)).    Radiology Studies: No results found.   Scheduled Meds: . allopurinol  100 mg Oral BID  . amitriptyline  25-50 mg Oral QHS  . azelastine  2 spray Each Nare BID  . cyanocobalamin  1,000 mcg Intramuscular Daily  . DULoxetine  60 mg Oral Daily  . enoxaparin (LOVENOX) injection  40 mg Subcutaneous Daily  . famotidine  20 mg Oral Daily  . feeding supplement  1 Container Oral TID BM  . latanoprost  1 drop Both Eyes BID  . loratadine  10 mg Oral Daily  . losartan  50 mg Oral Daily  . mouth rinse  15 mL Mouth Rinse BID  . pantoprazole  40 mg Oral Daily  . pindolol  2.5 mg Oral BID  . simvastatin  40 mg Oral Daily  . timolol  1 drop Both Eyes BID   Continuous  Infusions: . sodium chloride 50 mL/hr at 06/13/17 0628  . thiamine injection       LOS: 3 days   Time Spent in minutes   30 minutes  Grasiela Jonsson D.O. on 06/13/2017 at 2:06 PM  Between 7am to 7pm - Pager - [phone]  After 7pm go to [email or web address] - password [email or web address]  And look for the night coverage person covering for me after hours  [facility] Office  [phone]

## 2017-06-13 NOTE — Progress Notes (Signed)
Pt has home unit and places self on/off.  RT will monitor. 

## 2017-06-14 LAB — GLUCOSE, CAPILLARY: GLUCOSE-CAPILLARY: 122 mg/dL — AB (ref 65–99)

## 2017-06-14 MED ORDER — SIMETHICONE 80 MG PO CHEW: 160.0000 mg | CHEWABLE_TABLET | Freq: Four times a day (QID) | ORAL | Status: DC | PRN

## 2017-06-14 NOTE — Progress Notes (Signed)
PROGRESS NOTE    PER BEAGLEY  ZYS:[id number] DOB: 11/18/1950 DOA: 06/09/2017 PCP: [name], MD   Brief Narrative:  HPI on 06/09/2017 by Dr. Ivor Costa [name] is a 67 y.o. male with medical history significant of hypertension, hyperlipidemia, GERD, gout, depression, peptic ulcer disease, BPH, prostate cancer (surgery, no radiation or chemotherapy), celiac disease, who presents with double vision, poor balance, nausea, vomiting.  Pt states that he has been having double vision in both eyes for more than 2 weeks. He also has decreased memory, dizziness, poor balance recently.Patient states that he has a chronic peripheral neuropathy in both feet, but no unilateral weakness in extremities. No facial droop, slurred speech. He has mild bilateral hearing loss recently. Pt was seen by opthalmologist, then seen here for MRI. MRI on 4/9 was negative.   Patient also reports nausea and vomiting, which has been going on for more than one week. He vomited 5 times today, no blood in the vomitus. He has poor appetite and decreased oral intake. No abdominal pain. His last bowel movement was yesterday. He states that he is not constipated. He was seen by Dr. [name] of GI on Wed last week and had endoscopy on Thursday of last week, but could not complete procedure due to too much food in pts stomach. Patient does not have chest pain, shortness breath, cough, fever or chills. No symptoms of UTI. Pt was seen by his PCP today and sent to ED for further evaluation and treatment.   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 [name], will continue thiamine 500 mg IV daily, transition to intramuscular upon discharge along with B12 injections -PT and OT recommended CIR however patient and wife would like him to be at nursing facility close to home -Social work consulted and appreciated -Pending PASRR  Double vision -Possibly secondary to the above  Hypertension -BP has been somewhat soft and associated with junctional tachycardia however been improving -Continue pindolol -Patient was given Cardizem during this admission, heart rate appears to have improved  Nausea/vomiting -Resolved, currently tolerating diet -Suspected to be secondary to the above -CT abdomen pelvis showed no acute abdominopelvic findings -Continue antiemetics as needed -wife complaining that patient burping too much since starting to eat -wife would like a barium swallow as this was to occur as an outpatient- however feels that since he is in the hospital, this should be done and the results can be followed as an outpatient -will add simethicone for possible relief of gas and belching  Obstructive sleep apnea -Continue CPAP nightly  Hyperlipidemia -Continue statin  Gout -Stable, Continue allopurinol  Depression -Stable, continue Cymbalta, amitriptyline  GERD -Continue PPI/H2 blockers  DVT Prophylaxis Lovenox  Code Status: Full  Family Communication: Family at bedside.  Wife via phone  Disposition Plan: Admitted.  Pending SNF, likely on 06/15/2017  Consultants Neurology Inpatient rehab  Procedures  None  Antibiotics   Anti-infectives (From admission, onward)   None      Subjective:   [name] seen and examined today. Family  worried that patient is now burping too much since starting to eat again. Family also concerned that patient has not been  bathed since admission. Patient currently has no complaints of chest pain, shortness of breath, abdominal pain, nausea, vomiting, diarrhea, constipation.   Objective:   Vitals:   06/13/17 0834 06/13/17 1925 06/14/17 0016 06/14/17 0320  BP: 100/66 125/84 113/61 104/75  Pulse: (!) 54 71 66 (!) 59  Resp: 15 18 18 18   Temp: 97.7 F (36.5 C)  98.1 F (36.7 C) 98.2 F (36.8 C)  TempSrc: Oral  Oral Oral  SpO2: 100% 99% 98% 98%  Weight:      Height:       No intake or output data in the 24 hours ending 06/14/17 1134 Filed Weights   06/10/17 8315 06/12/17 0405  Weight: 110.7 kg (244 lb 0.8 oz) 115.2 kg (253 lb 15.5 oz)   Exam  General: Well developed, well nourished, NAD, appears stated age  33: NCAT, mucous membranes moist.   Neck: Supple  Cardiovascular: S1 S2 auscultated, RRR, no murmur  Respiratory: Clear to auscultation bilaterally with equal chest rise  Abdomen: Soft, nontender, nondistended, + bowel sounds  Extremities: warm dry without cyanosis clubbing or edema  Neuro: AAOx3, nonfocal  Psych: Normal affect and demeanor with intact judgement and insight  Data Reviewed: I have personally reviewed following labs and imaging studies  CBC: Recent Labs  Lab 06/09/17 1225 06/09/17 1644 06/10/17 0458 06/13/17 0516  WBC 8.4  --  8.7 6.0  HGB 15.1 15.6 12.9* 12.8*  HCT 42.8 46.0 37.1* 37.5*  MCV 87.2  --  86.7 89.5  PLT 189  --  169 176   Basic Metabolic Panel: Recent Labs  Lab 06/09/17 1225 06/09/17 1644 06/10/17 0458 06/12/17 0659  NA 134* 135 135 142  K 4.3 4.5 3.7 4.0  CL 99* 97* 103 110  CO2 24  --  22 23  GLUCOSE 177* 177* 121* 102*  BUN 10 13 14 14   CREATININE 1.14 0.80 0.94 1.00  CALCIUM 9.9  --  9.0 9.0   GFR: Estimated Creatinine Clearance: 91.3 mL/min (by C-G formula based on SCr of 1 mg/dL). Liver Function Tests: Recent Labs  Lab 06/09/17 1225  AST 20  ALT 15*  ALKPHOS 88  BILITOT 0.9  PROT 6.9  ALBUMIN 4.2   Recent Labs    Lab 06/09/17 1225  LIPASE 31   Recent Labs  Lab 06/09/17 1707  AMMONIA 15   Coagulation Profile: No results for input(s): INR, PROTIME in the last 168 hours. Cardiac Enzymes: No results for input(s): CKTOTAL, CKMB, CKMBINDEX, TROPONINI in the last 168 hours. BNP (last 3 results) No results for input(s): PROBNP in the last 8760 hours. HbA1C: No results for input(s): HGBA1C in the last 72 hours. CBG: Recent Labs  Lab 06/11/17 0628 06/12/17 0613 06/12/17 0736 06/13/17 0614 06/14/17 0752  GLUCAP 88 94 99 88 122*   Lipid Profile: No results for input(s): CHOL, HDL, LDLCALC, TRIG, CHOLHDL, LDLDIRECT in the last 72 hours. Thyroid Function Tests: No results for input(s): TSH, T4TOTAL, FREET4, T3FREE, THYROIDAB in the last 72 hours. Anemia Panel: No results for input(s): VITAMINB12, FOLATE, FERRITIN, TIBC, IRON, RETICCTPCT in the last 72 hours. Urine analysis:    Component Value Date/Time   COLORURINE YELLOW 06/09/2017 1413   APPEARANCEUR CLEAR 06/09/2017 1413   LABSPEC 1.025 06/09/2017 1413   PHURINE 8.0 06/09/2017 1413   GLUCOSEU NEGATIVE 06/09/2017 1413   HGBUR NEGATIVE 06/09/2017 1413  BILIRUBINUR NEGATIVE 06/09/2017 1413   KETONESUR 80 (A) 06/09/2017 1413   PROTEINUR NEGATIVE 06/09/2017 1413   UROBILINOGEN 0.2 04/19/2007 1436   NITRITE NEGATIVE 06/09/2017 1413   LEUKOCYTESUR NEGATIVE 06/09/2017 1413   Sepsis Labs: @LABRCNTIP (procalcitonin:4,lacticidven:4)  )No results found for this or any previous visit (from the past 240 hour(s)).    Radiology Studies: No results found.   Scheduled Meds: . allopurinol  100 mg Oral BID  . amitriptyline  25-50 mg Oral QHS  . azelastine  2 spray Each Nare BID  . cyanocobalamin  1,000 mcg Intramuscular Daily  . DULoxetine  60 mg Oral Daily  . enoxaparin (LOVENOX) injection  40 mg Subcutaneous Daily  . famotidine  20 mg Oral Daily  . feeding supplement  1 Container Oral TID BM  . latanoprost  1 drop Both Eyes BID  .  loratadine  10 mg Oral Daily  . losartan  50 mg Oral Daily  . mouth rinse  15 mL Mouth Rinse BID  . pantoprazole  40 mg Oral Daily  . pindolol  2.5 mg Oral BID  . simvastatin  40 mg Oral Daily  . timolol  1 drop Both Eyes BID   Continuous Infusions: . sodium chloride 50 mL/hr at 06/13/17 0628  . thiamine injection 0 mg (06/13/17 1910)     LOS: 4 days   Time Spent in minutes   30 minutes  Tonique Mendonca D.O. on 06/14/2017 at 11:34 AM  Between 7am to 7pm - Pager - [phone]  After 7pm go to [email or web address] - password [email or web address]  And look for the night coverage person covering for me after hours  [facility] Office  [phone]

## 2017-06-14 NOTE — Progress Notes (Signed)
Pt places self on home cpap unit when ready for bed. RT will continue to monitor as needed.

## 2017-06-15 ENCOUNTER — Inpatient Hospital Stay (HOSPITAL_COMMUNITY): Payer: Medicare Other

## 2017-06-15 DIAGNOSIS — K449 Diaphragmatic hernia without obstruction or gangrene: Secondary | ICD-10-CM | POA: Diagnosis not present

## 2017-06-15 DIAGNOSIS — F39 Unspecified mood [affective] disorder: Secondary | ICD-10-CM | POA: Diagnosis not present

## 2017-06-15 DIAGNOSIS — G9009 Other idiopathic peripheral autonomic neuropathy: Secondary | ICD-10-CM | POA: Diagnosis not present

## 2017-06-15 DIAGNOSIS — R000 Tachycardia, unspecified: Secondary | ICD-10-CM | POA: Diagnosis not present

## 2017-06-15 DIAGNOSIS — R112 Nausea with vomiting, unspecified: Secondary | ICD-10-CM | POA: Diagnosis not present

## 2017-06-15 DIAGNOSIS — I10 Essential (primary) hypertension: Secondary | ICD-10-CM | POA: Diagnosis not present

## 2017-06-15 DIAGNOSIS — F329 Major depressive disorder, single episode, unspecified: Secondary | ICD-10-CM | POA: Diagnosis not present

## 2017-06-15 DIAGNOSIS — E512 Wernicke's encephalopathy: Secondary | ICD-10-CM | POA: Diagnosis not present

## 2017-06-15 DIAGNOSIS — K219 Gastro-esophageal reflux disease without esophagitis: Secondary | ICD-10-CM | POA: Diagnosis not present

## 2017-06-15 DIAGNOSIS — Z741 Need for assistance with personal care: Secondary | ICD-10-CM | POA: Diagnosis not present

## 2017-06-15 DIAGNOSIS — E785 Hyperlipidemia, unspecified: Secondary | ICD-10-CM | POA: Diagnosis not present

## 2017-06-15 DIAGNOSIS — R2681 Unsteadiness on feet: Secondary | ICD-10-CM | POA: Diagnosis not present

## 2017-06-15 DIAGNOSIS — H532 Diplopia: Secondary | ICD-10-CM | POA: Diagnosis not present

## 2017-06-15 DIAGNOSIS — M109 Gout, unspecified: Secondary | ICD-10-CM | POA: Diagnosis not present

## 2017-06-15 LAB — GLUCOSE, CAPILLARY: Glucose-Capillary: 105 mg/dL — ABNORMAL HIGH (ref 65–99)

## 2017-06-15 MED ORDER — THIAMINE HCL 100 MG/ML IJ SOLN: 500.0000 mg | Freq: Every day | INTRAMUSCULAR | Status: DC

## 2017-06-15 MED ORDER — SIMETHICONE 80 MG PO CHEW: 160.0000 mg | CHEWABLE_TABLET | Freq: Four times a day (QID) | ORAL | 0 refills | Status: AC | PRN

## 2017-06-15 MED ORDER — CYANOCOBALAMIN 1000 MCG/ML IJ SOLN: 1000.0000 ug | INTRAMUSCULAR | 0 refills | Status: DC

## 2017-06-15 NOTE — Care Management Important Message (Signed)
Important Message  Patient Details  Name: [name] MRN: [id number] Date of Birth: [birth year]   Medicare Important Message Given:  Yes    Orbie Pyo 06/15/2017, 1:37 PM

## 2017-06-15 NOTE — Clinical Social Work Note (Signed)
Clinical Social Worker facilitated patient discharge including contacting patient family and facility to confirm patient discharge plans.  Clinical information faxed to facility and family agreeable with plan.  PT's wife will transport pt to [facility]--pt going to room 311 .  RN to call [phone] for report prior to discharge.  Clinical Social Worker will sign off for now as social work intervention is no longer needed. Please consult [place] again if new need arises.  [place], [place]

## 2017-06-15 NOTE — Discharge Instructions (Signed)
Vitamin B12 Deficiency Vitamin B12 deficiency means that your body is not getting enough vitamin B12. Your body needs vitamin B12 for important bodily functions. If you do not have enough vitamin B12 in your body, you can have health problems. Follow these instructions at home:  Take supplements only as told by your doctor. Follow the directions carefully.  Get any shots (injections) as told by your doctor. Do not miss your visits to the doctor.  Eat lots of healthy foods that contain vitamin B12. Ask your doctor if you should work with someone who is trained in how food affects health (dietitian). Foods that contain vitamin B12 include: ? Meat. ? Meat from birds (poultry). ? Fish. ? Eggs. ? Cereal and dairy products that are fortified. This means that vitamin B12 has been added to the food. Check the label on the package to see if the food is fortified.  Do not drink too much (do not abuse) alcohol.  Keep all follow-up visits as told by your doctor. This is important. Contact a doctor if:  Your symptoms come back. Get help right away if:  You have trouble breathing.  You have chest pain.  You get dizzy.  You pass out (lose consciousness). This information is not intended to replace advice given to you by your health care provider. Make sure you discuss any questions you have with your health care provider. Document Released: 01/30/2011 Document Revised: 07/19/2015 Document Reviewed: 06/28/2014 Elsevier Interactive Patient Education  2018 [employer]  

## 2017-06-15 NOTE — Progress Notes (Signed)
Nurse went over discharge with patient and family. Patient and family verbalized understanding of discharge. All questions and concerns addressed. Discharging to [facility] Via wife. All belongings sent with patient. Nurse will call report

## 2017-06-15 NOTE — Care Management Important Message (Signed)
Important Message  Patient Details  Name: [name] MRN: [id number] Date of Birth: [birth year]   Medicare Important Message Given:  Yes    Orbie Pyo 06/15/2017, 1:36 PM

## 2017-06-15 NOTE — Clinical Social Work Placement (Signed)
   CLINICAL SOCIAL WORK PLACEMENT  NOTE  Date:  06/15/2017  Patient Details  Name: [name] MRN: [id number] Date of Birth: May 03, 1950  Clinical Social Work is seeking post-discharge placement for this patient at the [place] level of care (*CSW will initial, date and re-position this form in  chart as items are completed):      Patient/family provided with [facility] Work Department's list of facilities offering this level of care within the geographic area requested by the patient (or if unable, by the patient's family).  Yes   Patient/family informed of their freedom to choose among providers that offer the needed level of care, that participate in Medicare, Medicaid or managed care program needed by the patient, have an available bed and are willing to accept the patient.      Patient/family informed of [facility]'s ownership interest in [facility] and [facility], as well as of the fact that they are under no obligation to receive care at these facilities.  PASRR submitted to EDS on       PASRR number received on 06/15/17     Existing PASRR number confirmed on       FL2 transmitted to all facilities in geographic area requested by pt/family on 06/15/17     FL2 transmitted to all facilities within larger geographic area on       Patient informed that his/her managed care company has contracts with or will negotiate with certain facilities, including the following:        Yes   Patient/family informed of bed offers received.  Patient chooses bed at [facility]     Physician recommends and patient chooses bed at      Patient to be transferred to [facility] on 06/15/17.  Patient to be transferred to facility by Wife     Patient family notified on 06/15/17 of transfer.  Name of family member notified:  [name], wife     PHYSICIAN       Additional Comment:    _______________________________________________ Eileen Stanford,  LCSW 06/15/2017, 12:28 PM

## 2017-06-15 NOTE — Discharge Summary (Addendum)
Physician Discharge Summary  [name]:[id number] DOB: [birth year] DOA: 06/09/2017  PCP: [name] Bien, MD  Admit date: 06/09/2017 Discharge date: 06/15/2017  Time spent: 45 minutes  Recommendations for Outpatient Follow-up:  Patient will be discharged to skilled nursing facility, continue physical and occupational therapy (as well as visual therapy).  Patient will need to follow up with primary care provider within one week of discharge, follow Vitamin B1 and B12 levels.  Follow up with gastroenterologist, Dr. Earlean Taylor. Patient should continue medications as prescribed.  Patient should follow a soft/gluten free diet.   Discharge Diagnoses:  Acute encephalopathy likely secondary to Wernicke's and vitamin deficiency Double vision Hypertension Nausea/vomiting Obstructive sleep apnea Hyperlipidemia Gout Depression GERD  Discharge Condition: Stable  Diet recommendation: soft/gluten free  Filed Weights   06/10/17 2355 06/12/17 0405  Weight: 110.7 kg (244 lb 0.8 oz) 115.2 kg (253 lb 15.5 oz)    History of present illness:  on 06/09/2017 by Dr. Kristen Loader Taylor a 67 y.o.malewith medical history significant ofhypertension, hyperlipidemia, GERD, gout, depression, peptic ulcer disease, BPH, prostate cancer (surgery, no radiation or chemotherapy), celiac disease, who presents with double vision, poor balance, nausea, vomiting.  Pt states that he has been having double vision in both eyes for more than 2 weeks. He also has decreased memory, dizziness, poor balance recently.Patient states that he has a chronic peripheral neuropathy in both feet, but no unilateral weakness in extremities. No facial droop, slurred speech. He has mild bilateral hearing loss recently. Ptwas seen by opthalmologist, then seen here for MRI. MRI on 4/9 was negative.   Patient also reports nausea and vomiting, which has been going on for more than one week. He vomited 5 times today, no blood  in the vomitus. He has poor appetite and decreased oral intake. No abdominal pain. His last bowel movement was yesterday. He states that he is not constipated. He was seen byDr. Thana Taylor of GI on Wed last week and had endoscopy on Thursday of last week, but could not complete procedure due to too much food in pts stomach. Patient does not have chest pain, shortness breath, cough, fever or chills. No symptoms of UTI.Ptwas seen by his PCPtodayand sent to ED for further evaluation and treatment.  Hospital Course:  Acute encephalopathy likely secondary to Wernicke's and vitamin deficiency -Patient has had history of gastric bypass surgery with likely poor vitamin absorption -Patient appears to have bilateral partial cranial nerve VI palsies, ataxia with increased confusion, upon admission -MRI brain showed no acute intracranial normality or causative finding of diplopia -Neurology consulted and appreciated and recommended vitamin B12 and vitamin B1 supplementation -Patient received high-dose IV thiamine for 3 days as well as parenteral B12 supplementation -Discussed with Dr. Rory Taylor, will continue thiamine 500 mg IV daily, transition to intramuscular upon discharge along with B12 injections -Given possible malabsorption of oral medications, will discharge patient with weekly B12 SQ injections for at least one month, as well as Thiamin IM injections daily. Vitamin B1 and B12 levels should be followed by PCP for correct dosing.   -PT and OT recommended CIR however patient and wife would like him to be at nursing facility close to home -Social work consulted and appreciated -Would continue OT with optical therapy if possible  Double vision -Possibly secondary to the above  Hypertension -BP has been somewhat soft and associated with junctional tachycardia however been improving -Continue pindolol -Patient was given Cardizem during this admission, heart rate appears to have  improved  Nausea/vomiting -Resolved, currently tolerating diet -Suspected to be secondary to the above -CT abdomen pelvis showed no acute abdominopelvic findings -Continue antiemetics as needed -wife complaining that patient burping too much since starting to eat -wife would like a barium swallow as this was to occur as an outpatient- however feels that since he is in the hospital, this should be done and the results can be followed as an outpatient -Results of UGI series/barium: 1. The gastrojejunostomy is patent although contrast extension through the ostomy site is sluggish. 2. Contrast extends into the duodenal bulb but not significantly into the remainder of the duodenum. This likely correlates with the patient's provided history of gastric outlet obstruction related to prior peptic stricturing. 3. Schatzki ring measures 1.7 cm in diameter. This is in a size range which can sometimes be associated with symptoms. 4. Small type 1 hiatal hernia. 5. Several episodes of gastroesophageal reflux are noted, associated with mild thickening of the distal esophageal folds. No distal esophageal ulcer identified.  -Continue simethicone for possible relief of gas and belching  Obstructive sleep apnea -Continue CPAP nightly  Hyperlipidemia -Continue statin  Gout -Stable, Continue allopurinol  Depression -Stable, continue Cymbalta, amitriptyline  GERD -Continue PPI/H2 blockers  Consultants Neurology Inpatient rehab  Procedures  None  Discharge Exam: Vitals:   06/14/17 2343 06/15/17 0449  BP: 111/75 109/72  Pulse: 66 (!) 57  Resp: 18 18  Temp:  98.3 F (36.8 C)  SpO2: 99% 99%     General: Well developed, well nourished, NAD, appears stated age  HEENT: NCAT, mucous membranes moist.  Neck: Supple  Cardiovascular: S1 S2 auscultated, no rubs, murmurs or gallops. Regular rate and rhythm.  Respiratory: Clear to auscultation bilaterally with equal chest rise  Abdomen:  Soft, nontender, nondistended, + bowel sounds  Extremities: warm dry without cyanosis clubbing or edema  Neuro: AAOx3, nonfocal  Psych: Normal affect and demeanor with intact judgement and insight, pleasant  Discharge Instructions Discharge Instructions    Discharge instructions   Complete by:  As directed    Patient will be discharged to skilled nursing facility, continue physical and occupational therapy (as well as visual therapy).  Patient will need to follow up with primary care provider within one week of discharge, follow Vitamin B1 and B12 levels.  Follow up with gastroenterologist, Dr. Earlean Taylor. Patient should continue medications as prescribed.  Patient should follow a soft diet.     Allergies as of 06/15/2017      Reactions   Gabapentin Other (See Comments)   Exacerbated neuropathy symptoms   Gluten Meal Diarrhea   Gas, bloating   Lactose Diarrhea      Medication List    STOP taking these medications   omeprazole 40 MG capsule Commonly known as:  PRILOSEC     TAKE these medications   allopurinol 100 MG tablet Commonly known as:  ZYLOPRIM Take 100 mg by mouth 2 (two) times daily.   amitriptyline 25 MG tablet Commonly known as:  ELAVIL Take 25-50 mg by mouth at bedtime.   azelastine 0.1 % nasal spray Commonly known as:  ASTELIN Place 2 sprays into both nostrils 2 (two) times daily.   cyanocobalamin 1000 MCG/ML injection Commonly known as:  (VITAMIN B-12) Inject 1 mL (1,000 mcg total) into the muscle once a week.   DULoxetine 60 MG capsule Commonly known as:  CYMBALTA Take 60 mg by mouth daily.   loratadine 10 MG tablet Commonly known as:  CLARITIN Take 10 mg by mouth daily.  losartan 50 MG tablet Commonly known as:  COZAAR Take 50 mg by mouth daily.   pantoprazole 40 MG tablet Commonly known as:  PROTONIX Take 40 mg by mouth daily.   pindolol 5 MG tablet Commonly known as:  VISKEN Take 0.5 tablets (2.5 mg total) by mouth 2 (two) times daily.    ranitidine 150 MG capsule Commonly known as:  ZANTAC Take 150 mg by mouth daily.   simethicone 80 MG chewable tablet Commonly known as:  MYLICON Chew 2 tablets (160 mg total) by mouth 4 (four) times daily as needed for flatulence.   simvastatin 40 MG tablet Commonly known as:  ZOCOR Take 40 mg by mouth daily.   thiamine 100 MG/ML injection Commonly known as:  B-1 Inject 5 mLs (500 mg total) into the muscle daily.   timolol 0.5 % ophthalmic solution Commonly known as:  TIMOPTIC Place 1 drop into the left eye 2 (two) times daily.   XALATAN 0.005 % ophthalmic solution Generic drug:  latanoprost Place 1 drop into the left eye 2 (two) times daily.      Allergies  Allergen Reactions  . Gabapentin Other (See Comments)    Exacerbated neuropathy symptoms  . Gluten Meal Diarrhea    Gas, bloating  . Lactose Diarrhea   Follow-up Information    [name] Bien, MD. Schedule an appointment as soon as possible for a visit in 1 week(s).   Specialty:  Family Medicine Why:  Hospital follow up Contact information: [address] [place] [address] [phone]        Richmond Campbell, MD. Schedule an appointment as soon as possible for a visit in 1 week(s).   Specialty:  Gastroenterology Why:  Hospital follow up Contact information: [place] [address] [phone]            The results of significant diagnostics from this hospitalization (including imaging, microbiology, ancillary and laboratory) are listed below for reference.    Significant Diagnostic Studies: Dg Chest 1 View  Result Date: 06/09/2017 CLINICAL DATA:  Dizziness and nausea. EXAM: CHEST  1 VIEW COMPARISON:  Chest x-ray dated March 16, 2007. FINDINGS: The heart size and mediastinal contours are within normal limits. Normal pulmonary vascularity. No focal consolidation, pleural effusion, or pneumothorax. No acute osseous abnormality. Interval right shoulder arthroplasty,  partially visualized. IMPRESSION: No active disease. Electronically Signed   By: Titus Dubin M.D.   On: 06/09/2017 17:25   Mr Brain Wo Contrast  Result Date: 06/09/2017 CLINICAL DATA:  Diplopia and left eye ophthalmoplegia. EXAM: MRI HEAD WITHOUT CONTRAST TECHNIQUE: Multiplanar, multiecho pulse sequences of the brain and surrounding structures were obtained without intravenous contrast. COMPARISON:  06/02/2017 brain MRI FINDINGS: BRAIN: The midline structures are normal. There is no acute infarct or acute hemorrhage. No mass lesion, hydrocephalus, dural abnormality or extra-axial collection. Minimal white matter hyperintensity, nonspecific and commonly seen in asymptomatic patients of this age. Focus of hyperintense T2-weighted signal in the right parietal white matter is favored to be a prominent perivascular space. No age-advanced or lobar predominant atrophy. No chronic microhemorrhage or superficial siderosis. VASCULAR: Major intracranial arterial and venous sinus flow voids are preserved. SKULL AND UPPER CERVICAL SPINE: The visualized skull base, calvarium, upper cervical spine and extracranial soft tissues are normal. SINUSES/ORBITS: No fluid levels or advanced mucosal thickening. No mastoid or middle ear effusion. Normal orbits. IMPRESSION: No acute intracranial abnormality or causative finding of diplopia. Electronically Signed   By: Cletus Gash.D.  On: 06/09/2017 22:48   Mr Brain W And Wo Contrast  Result Date: 06/02/2017 CLINICAL DATA:  Double vision. Cranial nerve palsy or stroke. Worsening balance. EXAM: MRI HEAD WITHOUT AND WITH CONTRAST TECHNIQUE: Multiplanar, multiecho pulse sequences of the brain and surrounding structures were obtained without and with intravenous contrast. CONTRAST:  22mL MULTIHANCE GADOBENATE DIMEGLUMINE 529 MG/ML IV SOLN COMPARISON:  None. FINDINGS: BRAIN: The midline structures are normal. There is no acute infarct or acute hemorrhage. No mass lesion,  hydrocephalus, dural abnormality or extra-axial collection. Minimal white matter hyperintensity, nonspecific and commonly seen in asymptomatic patients of this age. No age-advanced or lobar predominant atrophy. No chronic microhemorrhage or superficial siderosis. No contrast-enhancing lesions. VASCULAR: Major intracranial arterial and venous sinus flow voids are preserved. SKULL AND UPPER CERVICAL SPINE: The visualized skull base, calvarium, upper cervical spine and extracranial soft tissues are normal. SINUSES/ORBITS: No fluid levels or advanced mucosal thickening. No mastoid or middle ear effusion. Normal orbits. IMPRESSION: Normal aging brain.  No findings to account for reported diplopia. Electronically Signed   By: Ulyses Jarred M.D.   On: 06/02/2017 18:46   Ct Abdomen Pelvis W Contrast  Result Date: 06/09/2017 CLINICAL DATA:  Generalized weakness. Nausea and vomiting for 2 weeks EXAM: CT ABDOMEN AND PELVIS WITH CONTRAST TECHNIQUE: Multidetector CT imaging of the abdomen and pelvis was performed using the standard protocol following bolus administration of intravenous contrast. CONTRAST:  119mL ISOVUE-300 IOPAMIDOL (ISOVUE-300) INJECTION 61% COMPARISON:  None. FINDINGS: Lower chest: Lung bases are clear. Hepatobiliary: No focal hepatic lesion. No biliary duct dilatation. Gallbladder is normal. Common bile duct is normal. Pancreas: Pancreas is normal. No ductal dilatation. No pancreatic inflammation. Spleen: Normal spleen Adrenals/urinary tract: Adrenal glands and kidneys are normal. The ureters and bladder normal. Stomach/Bowel: Stomach is normal. There is a gastroenteric anastomosis along the gastric antral region anteriorly. Duodenum and small-bowel are normal without evidence of inflammation or obstruction. There is 2 adjacent midline ventral hernias which contain loops of nonobstructed small bowel. Distal small bowel leading up the terminal ileum is normal. Appendix normal. Ascending, transverse, and  descending colon are normal. Vascular/Lymphatic: Abdominal aorta is normal caliber with atherosclerotic calcification. There is no retroperitoneal or periportal lymphadenopathy. No pelvic lymphadenopathy. Reproductive: Post prostatectomy Other: No free fluid.  LEFT inguinal hernia repair. Musculoskeletal: No aggressive osseous lesion. IMPRESSION: 1. Two midline ventral hernias contain loops of nonobstructed small bowel. 2. Postsurgical change in the gastric antrum with gastroenteric anastomosis. No complication. 3. No acute abdominopelvic findings. Electronically Signed   By: Suzy Bouchard M.D.   On: 06/09/2017 22:19   Dg Abd 2 Views  Result Date: 06/09/2017 CLINICAL DATA:  Vomiting EXAM: ABDOMEN - 2 VIEW COMPARISON:  None. FINDINGS: Supine and upright images obtained. There is moderate stool in the colon. There is no evident bowel dilatation or air-fluid level to suggest bowel obstruction. No free air. There is a surgical clip in the medial left upper quadrant. Visualized lung bases are clear. There are is a phlebolith in the lower left pelvis. IMPRESSION: Moderate stool in colon.  No evident bowel obstruction or free air. Electronically Signed   By: Lowella Grip III M.D.   On: 06/09/2017 19:10   Dg Ugi W/high Density W/kub  Result Date: 06/15/2017 CLINICAL DATA:  Nausea and vomiting. Belching. Prior peptic ulcer disease with gastrojejunostomy in 2008. Retained food in stomach with incomplete EGD last week. EXAM: UPPER GI SERIES WITH KUB TECHNIQUE: After obtaining a scout radiograph a routine upper GI series was performed  using thin and high density barium. FLUOROSCOPY TIME:  Fluoroscopy Time:  3 minutes, 54 seconds Radiation Exposure Index (if provided by the fluoroscopic device): 70.3 mGy Number of Acquired Spot Images: 4 COMPARISON:  06/09/2017 FINDINGS: Initial KUB demonstrates a moderate amount of stool throughout the colon. Pharyngeal phase of swallowing appears normal. Primary peristaltic  waves in the esophagus were normal on 04/04 swallows. Small type 1 hiatal hernia. Mildly thickened distal esophageal folds. Episodes of gastroesophageal reflux to the level of the midesophagus were noted during the course of today's exam. Distal esophageal mucosal ring measures 1.7 cm in maximum diameter. This is shown on image 20/12. The patient has sluggish, dilute contrast extension towards the duodenal bulb and descending duodenum, which appears to be non preferential. There is also a gastrojejunostomy along the anterior margin of the gastric body, likewise with sluggish extension of contrast medium through the gastrojejunostomy even in the prone position. Only a small amount of contrast extends into nondilated proximal jejunal loops. No overt gastric dilatation. A 13 mm barium tablet initially paused in the mid esophagus and at the level of the Schatzki ring before proceeding into the stomach with several swallows of water. IMPRESSION: 1. The gastrojejunostomy is patent although contrast extension through the ostomy site is sluggish. 2. Contrast extends into the duodenal bulb but not significantly into the remainder of the duodenum. This likely correlates with the patient's provided history of gastric outlet obstruction related to prior peptic stricturing. 3. Schatzki ring measures 1.7 cm in diameter. This is in a size range which can sometimes be associated with symptoms. 4. Small type 1 hiatal hernia. 5. Several episodes of gastroesophageal reflux are noted, associated with mild thickening of the distal esophageal folds. No distal esophageal ulcer identified. Electronically Signed   By: Van Clines M.D.   On: 06/15/2017 09:01    Microbiology: No results found for this or any previous visit (from the past 240 hour(s)).   Labs: Basic Metabolic Panel: Recent Labs  Lab 06/09/17 1225 06/09/17 1644 06/10/17 0458 06/12/17 0659  NA 134* 135 135 142  K 4.3 4.5 3.7 4.0  CL 99* 97* 103 110  CO2  24  --  22 23  GLUCOSE 177* 177* 121* 102*  BUN 10 13 14 14   CREATININE 1.14 0.80 0.94 1.00  CALCIUM 9.9  --  9.0 9.0   Liver Function Tests: Recent Labs  Lab 06/09/17 1225  AST 20  ALT 15*  ALKPHOS 88  BILITOT 0.9  PROT 6.9  ALBUMIN 4.2   Recent Labs  Lab 06/09/17 1225  LIPASE 31   Recent Labs  Lab 06/09/17 1707  AMMONIA 15   CBC: Recent Labs  Lab 06/09/17 1225 06/09/17 1644 06/10/17 0458 06/13/17 0516  WBC 8.4  --  8.7 6.0  HGB 15.1 15.6 12.9* 12.8*  HCT 42.8 46.0 37.1* 37.5*  MCV 87.2  --  86.7 89.5  PLT 189  --  169 166   Cardiac Enzymes: No results for input(s): CKTOTAL, CKMB, CKMBINDEX, TROPONINI in the last 168 hours. BNP: BNP (last 3 results) No results for input(s): BNP in the last 8760 hours.  ProBNP (last 3 results) No results for input(s): PROBNP in the last 8760 hours.  CBG: Recent Labs  Lab 06/12/17 0613 06/12/17 0736 06/13/17 0614 06/14/17 0752 06/15/17 0557  GLUCAP 94 99 88 122* 105*       Signed:  Haron Beilke  [facility] 06/15/2017, 12:47 PM

## 2017-06-16 DIAGNOSIS — E512 Wernicke's encephalopathy: Secondary | ICD-10-CM | POA: Diagnosis not present

## 2017-06-16 DIAGNOSIS — R000 Tachycardia, unspecified: Secondary | ICD-10-CM | POA: Diagnosis not present

## 2017-06-16 DIAGNOSIS — G9009 Other idiopathic peripheral autonomic neuropathy: Secondary | ICD-10-CM | POA: Diagnosis not present

## 2017-06-16 DIAGNOSIS — F39 Unspecified mood [affective] disorder: Secondary | ICD-10-CM

## 2017-06-16 DIAGNOSIS — I10 Essential (primary) hypertension: Secondary | ICD-10-CM | POA: Diagnosis not present

## 2017-06-17 LAB — VITAMIN B1: Vitamin B1 (Thiamine): 34.1 nmol/L — ABNORMAL LOW (ref 66.5–200.0)

## 2017-06-18 ENCOUNTER — Telehealth: Payer: Self-pay | Admitting: Internal Medicine

## 2017-06-18 NOTE — Telephone Encounter (Signed)
Copied from [place] [id number]. Topic: Quick Communication - See Telephone Encounter >> Jun 18, 2017 10:03 AM Aurelio Brash B wrote: CRM for notification. See Telephone encounter for: 06/18/17.  PT is a resident at [facility]. Clair Gulling from [facility] sent fax to office this morning  clarifying pt's  thiamine.  Any questions Jim's contact number is [phone]

## 2017-06-18 NOTE — Telephone Encounter (Signed)
Spoke to [place] and advised it is 500mg 

## 2017-06-22 ENCOUNTER — Ambulatory Visit: Payer: 59 | Admitting: Gastroenterology

## 2017-06-22 ENCOUNTER — Encounter

## 2017-06-25 DIAGNOSIS — H4922 Sixth [abducent] nerve palsy, left eye: Secondary | ICD-10-CM | POA: Diagnosis not present

## 2017-06-25 DIAGNOSIS — E539 Vitamin B deficiency, unspecified: Secondary | ICD-10-CM | POA: Diagnosis not present

## 2017-06-25 DIAGNOSIS — Z79899 Other long term (current) drug therapy: Secondary | ICD-10-CM | POA: Diagnosis not present

## 2017-06-25 DIAGNOSIS — K219 Gastro-esophageal reflux disease without esophagitis: Secondary | ICD-10-CM | POA: Diagnosis not present

## 2017-06-25 DIAGNOSIS — E512 Wernicke's encephalopathy: Secondary | ICD-10-CM | POA: Diagnosis not present

## 2017-06-29 ENCOUNTER — Telehealth: Payer: Self-pay | Admitting: Internal Medicine

## 2017-06-29 NOTE — Telephone Encounter (Signed)
Helene Kelp with [facility] was made aware that Dr Silvio Pate is out of office until 07/02/17 and Larene Beach CMA was not sure if needed to 3' or 45' appt to establish as new pt.FYI to DR Silvio Pate.

## 2017-06-29 NOTE — Telephone Encounter (Signed)
He will not be a new patient You can set him up for a 15 minute appointment. I thought his vitamin B1 would be changed to oral upon his discharge. I don't even know if we can do injections of this in the office

## 2017-06-29 NOTE — Telephone Encounter (Signed)
He should be taking oral thiamine (?100mg ) daily and I can recheck his levels when he comes in  I will need to copy his records from [facility]---because I already did his admission and he has records from the hospital

## 2017-06-29 NOTE — Telephone Encounter (Signed)
Copied from [place] [id number]. Topic: Quick Communication - See Telephone Encounter >> Jun 29, 2017  9:03 AM Robina Ade, Helene Kelp D wrote: CRM for notification. See Telephone encounter for: 06/29/17. Patient wife would like to talk to Dr. Silvio Pate about establishing patient care with him. He saw patient at [facility] in rehab on the 3rd floor. Patient is also in need of B1 100mg  injections that his pcp refuse to give him. Please call wife back, thanks.

## 2017-06-30 DIAGNOSIS — E512 Wernicke's encephalopathy: Secondary | ICD-10-CM | POA: Diagnosis not present

## 2017-06-30 DIAGNOSIS — R739 Hyperglycemia, unspecified: Secondary | ICD-10-CM | POA: Diagnosis not present

## 2017-06-30 DIAGNOSIS — I10 Essential (primary) hypertension: Secondary | ICD-10-CM | POA: Diagnosis not present

## 2017-06-30 DIAGNOSIS — H4923 Sixth [abducent] nerve palsy, bilateral: Secondary | ICD-10-CM | POA: Diagnosis not present

## 2017-06-30 DIAGNOSIS — E539 Vitamin B deficiency, unspecified: Secondary | ICD-10-CM | POA: Diagnosis not present

## 2017-06-30 DIAGNOSIS — Z79899 Other long term (current) drug therapy: Secondary | ICD-10-CM | POA: Diagnosis not present

## 2017-06-30 DIAGNOSIS — E876 Hypokalemia: Secondary | ICD-10-CM | POA: Diagnosis not present

## 2017-06-30 NOTE — Telephone Encounter (Signed)
[name] will fax his notes of Dr Alla German from [facility].

## 2017-06-30 NOTE — Telephone Encounter (Signed)
Spoke to wife per DPR. She said they had a hard time getting the B1. [facility] finally found a distributor they could get it from and he should be getting today or tomorrow. She will call back to schedule his appt with Dr Silvio Pate.   Left a message for Aisha at [facility] at [phone]

## 2017-07-01 DIAGNOSIS — H4010X0 Unspecified open-angle glaucoma, stage unspecified: Secondary | ICD-10-CM | POA: Diagnosis not present

## 2017-07-01 DIAGNOSIS — E782 Mixed hyperlipidemia: Secondary | ICD-10-CM | POA: Diagnosis not present

## 2017-07-01 DIAGNOSIS — F329 Major depressive disorder, single episode, unspecified: Secondary | ICD-10-CM | POA: Diagnosis not present

## 2017-07-01 DIAGNOSIS — E538 Deficiency of other specified B group vitamins: Secondary | ICD-10-CM | POA: Diagnosis not present

## 2017-07-01 DIAGNOSIS — M109 Gout, unspecified: Secondary | ICD-10-CM | POA: Diagnosis not present

## 2017-07-01 DIAGNOSIS — Z9884 Bariatric surgery status: Secondary | ICD-10-CM | POA: Insufficient documentation

## 2017-07-01 DIAGNOSIS — E519 Thiamine deficiency, unspecified: Secondary | ICD-10-CM | POA: Diagnosis not present

## 2017-07-01 DIAGNOSIS — K219 Gastro-esophageal reflux disease without esophagitis: Secondary | ICD-10-CM | POA: Diagnosis not present

## 2017-07-01 DIAGNOSIS — C61 Malignant neoplasm of prostate: Secondary | ICD-10-CM | POA: Diagnosis not present

## 2017-07-01 DIAGNOSIS — I10 Essential (primary) hypertension: Secondary | ICD-10-CM | POA: Diagnosis not present

## 2017-07-01 DIAGNOSIS — G4733 Obstructive sleep apnea (adult) (pediatric): Secondary | ICD-10-CM | POA: Diagnosis not present

## 2017-07-01 DIAGNOSIS — R413 Other amnesia: Secondary | ICD-10-CM | POA: Diagnosis not present

## 2017-07-07 ENCOUNTER — Encounter: Payer: Self-pay | Admitting: Internal Medicine

## 2017-07-07 DIAGNOSIS — K279 Peptic ulcer, site unspecified, unspecified as acute or chronic, without hemorrhage or perforation: Secondary | ICD-10-CM | POA: Diagnosis not present

## 2017-07-07 DIAGNOSIS — G4733 Obstructive sleep apnea (adult) (pediatric): Secondary | ICD-10-CM | POA: Insufficient documentation

## 2017-07-07 DIAGNOSIS — E512 Wernicke's encephalopathy: Secondary | ICD-10-CM | POA: Diagnosis not present

## 2017-07-15 DIAGNOSIS — E519 Thiamine deficiency, unspecified: Secondary | ICD-10-CM | POA: Diagnosis not present

## 2017-08-04 DIAGNOSIS — H4922 Sixth [abducent] nerve palsy, left eye: Secondary | ICD-10-CM | POA: Diagnosis not present

## 2017-08-20 DIAGNOSIS — E559 Vitamin D deficiency, unspecified: Secondary | ICD-10-CM | POA: Diagnosis not present

## 2017-08-20 DIAGNOSIS — K219 Gastro-esophageal reflux disease without esophagitis: Secondary | ICD-10-CM | POA: Diagnosis not present

## 2017-08-20 DIAGNOSIS — E519 Thiamine deficiency, unspecified: Secondary | ICD-10-CM | POA: Diagnosis not present

## 2017-08-20 DIAGNOSIS — E538 Deficiency of other specified B group vitamins: Secondary | ICD-10-CM | POA: Diagnosis not present

## 2017-08-25 DIAGNOSIS — E0842 Diabetes mellitus due to underlying condition with diabetic polyneuropathy: Secondary | ICD-10-CM | POA: Diagnosis not present

## 2017-08-25 DIAGNOSIS — E0840 Diabetes mellitus due to underlying condition with diabetic neuropathy, unspecified: Secondary | ICD-10-CM | POA: Diagnosis not present

## 2017-08-25 DIAGNOSIS — G6289 Other specified polyneuropathies: Secondary | ICD-10-CM | POA: Diagnosis not present

## 2017-08-25 DIAGNOSIS — Z9884 Bariatric surgery status: Secondary | ICD-10-CM | POA: Diagnosis not present

## 2017-08-25 DIAGNOSIS — E512 Wernicke's encephalopathy: Secondary | ICD-10-CM | POA: Diagnosis not present

## 2017-08-25 DIAGNOSIS — R600 Localized edema: Secondary | ICD-10-CM | POA: Diagnosis not present

## 2017-08-25 DIAGNOSIS — I011 Acute rheumatic endocarditis: Secondary | ICD-10-CM | POA: Diagnosis not present

## 2017-08-25 DIAGNOSIS — R413 Other amnesia: Secondary | ICD-10-CM | POA: Diagnosis not present

## 2017-08-25 DIAGNOSIS — I519 Heart disease, unspecified: Secondary | ICD-10-CM | POA: Diagnosis not present

## 2017-08-25 DIAGNOSIS — E519 Thiamine deficiency, unspecified: Secondary | ICD-10-CM | POA: Diagnosis not present

## 2017-08-25 DIAGNOSIS — E079 Disorder of thyroid, unspecified: Secondary | ICD-10-CM | POA: Diagnosis not present

## 2017-08-25 DIAGNOSIS — Z87891 Personal history of nicotine dependence: Secondary | ICD-10-CM | POA: Diagnosis not present

## 2017-08-25 DIAGNOSIS — Z79899 Other long term (current) drug therapy: Secondary | ICD-10-CM | POA: Diagnosis not present

## 2017-08-26 DIAGNOSIS — M25562 Pain in left knee: Secondary | ICD-10-CM | POA: Diagnosis not present

## 2017-08-26 DIAGNOSIS — E559 Vitamin D deficiency, unspecified: Secondary | ICD-10-CM | POA: Diagnosis not present

## 2017-08-26 DIAGNOSIS — I10 Essential (primary) hypertension: Secondary | ICD-10-CM | POA: Diagnosis not present

## 2017-08-26 DIAGNOSIS — R413 Other amnesia: Secondary | ICD-10-CM | POA: Diagnosis not present

## 2017-08-26 DIAGNOSIS — E519 Thiamine deficiency, unspecified: Secondary | ICD-10-CM | POA: Diagnosis not present

## 2017-08-26 DIAGNOSIS — E782 Mixed hyperlipidemia: Secondary | ICD-10-CM | POA: Diagnosis not present

## 2017-08-26 DIAGNOSIS — M25561 Pain in right knee: Secondary | ICD-10-CM | POA: Diagnosis not present

## 2017-09-07 DIAGNOSIS — I519 Heart disease, unspecified: Secondary | ICD-10-CM | POA: Diagnosis not present

## 2017-09-07 DIAGNOSIS — I011 Acute rheumatic endocarditis: Secondary | ICD-10-CM | POA: Diagnosis not present

## 2017-09-18 DIAGNOSIS — F329 Major depressive disorder, single episode, unspecified: Secondary | ICD-10-CM | POA: Diagnosis not present

## 2017-09-18 DIAGNOSIS — G4733 Obstructive sleep apnea (adult) (pediatric): Secondary | ICD-10-CM | POA: Diagnosis not present

## 2017-09-18 DIAGNOSIS — G3184 Mild cognitive impairment, so stated: Secondary | ICD-10-CM | POA: Diagnosis not present

## 2017-11-02 ENCOUNTER — Ambulatory Visit: Payer: Medicare Other | Admitting: Adult Health

## 2017-11-02 ENCOUNTER — Encounter: Payer: Self-pay | Admitting: Adult Health

## 2017-11-02 ENCOUNTER — Telehealth: Payer: Self-pay | Admitting: *Deleted

## 2017-11-02 NOTE — Telephone Encounter (Signed)
Patient was no show for follow up with NP today.  

## 2017-11-09 DIAGNOSIS — C61 Malignant neoplasm of prostate: Secondary | ICD-10-CM | POA: Diagnosis not present

## 2017-11-09 DIAGNOSIS — N5203 Combined arterial insufficiency and corporo-venous occlusive erectile dysfunction: Secondary | ICD-10-CM | POA: Diagnosis not present

## 2017-11-09 DIAGNOSIS — E512 Wernicke's encephalopathy: Secondary | ICD-10-CM | POA: Diagnosis not present

## 2017-12-03 DIAGNOSIS — E559 Vitamin D deficiency, unspecified: Secondary | ICD-10-CM | POA: Diagnosis not present

## 2017-12-03 DIAGNOSIS — E519 Thiamine deficiency, unspecified: Secondary | ICD-10-CM | POA: Diagnosis not present

## 2017-12-03 DIAGNOSIS — I10 Essential (primary) hypertension: Secondary | ICD-10-CM | POA: Diagnosis not present

## 2017-12-10 DIAGNOSIS — E559 Vitamin D deficiency, unspecified: Secondary | ICD-10-CM | POA: Diagnosis not present

## 2017-12-10 DIAGNOSIS — E538 Deficiency of other specified B group vitamins: Secondary | ICD-10-CM | POA: Diagnosis not present

## 2017-12-10 DIAGNOSIS — Z23 Encounter for immunization: Secondary | ICD-10-CM | POA: Diagnosis not present

## 2017-12-10 DIAGNOSIS — Z0000 Encounter for general adult medical examination without abnormal findings: Secondary | ICD-10-CM | POA: Diagnosis not present

## 2017-12-10 DIAGNOSIS — E519 Thiamine deficiency, unspecified: Secondary | ICD-10-CM | POA: Diagnosis not present

## 2017-12-10 DIAGNOSIS — C61 Malignant neoplasm of prostate: Secondary | ICD-10-CM | POA: Diagnosis not present

## 2017-12-10 DIAGNOSIS — G4733 Obstructive sleep apnea (adult) (pediatric): Secondary | ICD-10-CM | POA: Diagnosis not present

## 2017-12-10 DIAGNOSIS — E782 Mixed hyperlipidemia: Secondary | ICD-10-CM | POA: Diagnosis not present

## 2017-12-10 DIAGNOSIS — I10 Essential (primary) hypertension: Secondary | ICD-10-CM | POA: Diagnosis not present

## 2017-12-10 DIAGNOSIS — K219 Gastro-esophageal reflux disease without esophagitis: Secondary | ICD-10-CM | POA: Diagnosis not present

## 2017-12-11 DIAGNOSIS — Z0000 Encounter for general adult medical examination without abnormal findings: Secondary | ICD-10-CM | POA: Diagnosis not present

## 2017-12-11 DIAGNOSIS — E782 Mixed hyperlipidemia: Secondary | ICD-10-CM | POA: Diagnosis not present

## 2017-12-11 DIAGNOSIS — G4733 Obstructive sleep apnea (adult) (pediatric): Secondary | ICD-10-CM | POA: Diagnosis not present

## 2017-12-11 DIAGNOSIS — K219 Gastro-esophageal reflux disease without esophagitis: Secondary | ICD-10-CM | POA: Diagnosis not present

## 2017-12-11 DIAGNOSIS — Z23 Encounter for immunization: Secondary | ICD-10-CM | POA: Diagnosis not present

## 2017-12-11 DIAGNOSIS — I10 Essential (primary) hypertension: Secondary | ICD-10-CM | POA: Diagnosis not present

## 2017-12-11 DIAGNOSIS — E559 Vitamin D deficiency, unspecified: Secondary | ICD-10-CM | POA: Diagnosis not present

## 2017-12-11 DIAGNOSIS — E538 Deficiency of other specified B group vitamins: Secondary | ICD-10-CM | POA: Diagnosis not present

## 2017-12-11 DIAGNOSIS — E519 Thiamine deficiency, unspecified: Secondary | ICD-10-CM | POA: Diagnosis not present

## 2017-12-11 DIAGNOSIS — C61 Malignant neoplasm of prostate: Secondary | ICD-10-CM | POA: Diagnosis not present

## 2017-12-15 DIAGNOSIS — H4922 Sixth [abducent] nerve palsy, left eye: Secondary | ICD-10-CM | POA: Diagnosis not present

## 2017-12-15 DIAGNOSIS — H2513 Age-related nuclear cataract, bilateral: Secondary | ICD-10-CM | POA: Diagnosis not present

## 2018-01-05 DIAGNOSIS — F329 Major depressive disorder, single episode, unspecified: Secondary | ICD-10-CM | POA: Diagnosis not present

## 2018-01-05 DIAGNOSIS — G4733 Obstructive sleep apnea (adult) (pediatric): Secondary | ICD-10-CM | POA: Diagnosis not present

## 2018-01-05 DIAGNOSIS — G4719 Other hypersomnia: Secondary | ICD-10-CM | POA: Diagnosis not present

## 2018-01-11 ENCOUNTER — Other Ambulatory Visit: Payer: Self-pay

## 2018-03-31 DIAGNOSIS — R413 Other amnesia: Secondary | ICD-10-CM | POA: Diagnosis not present

## 2018-03-31 DIAGNOSIS — R42 Dizziness and giddiness: Secondary | ICD-10-CM | POA: Diagnosis not present

## 2018-04-06 DIAGNOSIS — E519 Thiamine deficiency, unspecified: Secondary | ICD-10-CM | POA: Diagnosis not present

## 2018-04-06 DIAGNOSIS — I10 Essential (primary) hypertension: Secondary | ICD-10-CM | POA: Diagnosis not present

## 2018-04-06 DIAGNOSIS — E559 Vitamin D deficiency, unspecified: Secondary | ICD-10-CM | POA: Diagnosis not present

## 2018-04-06 DIAGNOSIS — E538 Deficiency of other specified B group vitamins: Secondary | ICD-10-CM | POA: Diagnosis not present

## 2018-04-08 DIAGNOSIS — F332 Major depressive disorder, recurrent severe without psychotic features: Secondary | ICD-10-CM | POA: Diagnosis not present

## 2018-04-08 DIAGNOSIS — E512 Wernicke's encephalopathy: Secondary | ICD-10-CM | POA: Diagnosis not present

## 2018-04-08 DIAGNOSIS — Z634 Disappearance and death of family member: Secondary | ICD-10-CM | POA: Diagnosis not present

## 2018-04-08 DIAGNOSIS — R413 Other amnesia: Secondary | ICD-10-CM | POA: Diagnosis not present

## 2018-04-08 DIAGNOSIS — Z79899 Other long term (current) drug therapy: Secondary | ICD-10-CM | POA: Diagnosis not present

## 2018-04-08 DIAGNOSIS — Z820 Family history of epilepsy and other diseases of the nervous system: Secondary | ICD-10-CM | POA: Diagnosis not present

## 2018-04-08 DIAGNOSIS — E538 Deficiency of other specified B group vitamins: Secondary | ICD-10-CM | POA: Diagnosis not present

## 2018-04-08 DIAGNOSIS — Z9884 Bariatric surgery status: Secondary | ICD-10-CM | POA: Diagnosis not present

## 2018-04-14 DIAGNOSIS — E559 Vitamin D deficiency, unspecified: Secondary | ICD-10-CM | POA: Diagnosis not present

## 2018-04-14 DIAGNOSIS — M109 Gout, unspecified: Secondary | ICD-10-CM | POA: Diagnosis not present

## 2018-04-14 DIAGNOSIS — I10 Essential (primary) hypertension: Secondary | ICD-10-CM | POA: Diagnosis not present

## 2018-04-14 DIAGNOSIS — C61 Malignant neoplasm of prostate: Secondary | ICD-10-CM | POA: Diagnosis not present

## 2018-04-14 DIAGNOSIS — E782 Mixed hyperlipidemia: Secondary | ICD-10-CM | POA: Diagnosis not present

## 2018-04-14 DIAGNOSIS — E538 Deficiency of other specified B group vitamins: Secondary | ICD-10-CM | POA: Diagnosis not present

## 2018-04-14 DIAGNOSIS — E519 Thiamine deficiency, unspecified: Secondary | ICD-10-CM | POA: Diagnosis not present

## 2018-08-05 DIAGNOSIS — H524 Presbyopia: Secondary | ICD-10-CM | POA: Diagnosis not present

## 2018-08-05 DIAGNOSIS — H401131 Primary open-angle glaucoma, bilateral, mild stage: Secondary | ICD-10-CM | POA: Diagnosis not present

## 2018-08-05 DIAGNOSIS — H5203 Hypermetropia, bilateral: Secondary | ICD-10-CM | POA: Diagnosis not present

## 2018-08-10 DIAGNOSIS — E538 Deficiency of other specified B group vitamins: Secondary | ICD-10-CM | POA: Diagnosis not present

## 2018-08-10 DIAGNOSIS — E782 Mixed hyperlipidemia: Secondary | ICD-10-CM | POA: Diagnosis not present

## 2018-08-10 DIAGNOSIS — K219 Gastro-esophageal reflux disease without esophagitis: Secondary | ICD-10-CM | POA: Diagnosis not present

## 2018-08-10 DIAGNOSIS — E559 Vitamin D deficiency, unspecified: Secondary | ICD-10-CM | POA: Diagnosis not present

## 2018-08-10 DIAGNOSIS — C61 Malignant neoplasm of prostate: Secondary | ICD-10-CM | POA: Diagnosis not present

## 2018-08-10 DIAGNOSIS — I10 Essential (primary) hypertension: Secondary | ICD-10-CM | POA: Diagnosis not present

## 2018-08-10 DIAGNOSIS — G4733 Obstructive sleep apnea (adult) (pediatric): Secondary | ICD-10-CM | POA: Diagnosis not present

## 2018-08-10 DIAGNOSIS — E519 Thiamine deficiency, unspecified: Secondary | ICD-10-CM | POA: Diagnosis not present

## 2018-08-10 DIAGNOSIS — M109 Gout, unspecified: Secondary | ICD-10-CM | POA: Diagnosis not present

## 2018-09-29 DIAGNOSIS — Z23 Encounter for immunization: Secondary | ICD-10-CM | POA: Diagnosis not present

## 2018-09-29 DIAGNOSIS — S91331A Puncture wound without foreign body, right foot, initial encounter: Secondary | ICD-10-CM | POA: Diagnosis not present

## 2018-09-29 DIAGNOSIS — R110 Nausea: Secondary | ICD-10-CM | POA: Diagnosis not present

## 2018-11-15 DIAGNOSIS — C61 Malignant neoplasm of prostate: Secondary | ICD-10-CM | POA: Diagnosis not present

## 2018-12-06 DIAGNOSIS — H401131 Primary open-angle glaucoma, bilateral, mild stage: Secondary | ICD-10-CM | POA: Diagnosis not present

## 2018-12-07 DIAGNOSIS — G8929 Other chronic pain: Secondary | ICD-10-CM | POA: Diagnosis not present

## 2018-12-07 DIAGNOSIS — E559 Vitamin D deficiency, unspecified: Secondary | ICD-10-CM | POA: Diagnosis not present

## 2018-12-07 DIAGNOSIS — M1711 Unilateral primary osteoarthritis, right knee: Secondary | ICD-10-CM | POA: Diagnosis not present

## 2018-12-07 DIAGNOSIS — Z6838 Body mass index (BMI) 38.0-38.9, adult: Secondary | ICD-10-CM | POA: Diagnosis not present

## 2018-12-07 DIAGNOSIS — E519 Thiamine deficiency, unspecified: Secondary | ICD-10-CM | POA: Diagnosis not present

## 2018-12-07 DIAGNOSIS — R110 Nausea: Secondary | ICD-10-CM | POA: Diagnosis not present

## 2018-12-07 DIAGNOSIS — E782 Mixed hyperlipidemia: Secondary | ICD-10-CM | POA: Diagnosis not present

## 2018-12-07 DIAGNOSIS — R413 Other amnesia: Secondary | ICD-10-CM | POA: Diagnosis not present

## 2018-12-07 DIAGNOSIS — M25561 Pain in right knee: Secondary | ICD-10-CM | POA: Diagnosis not present

## 2018-12-07 DIAGNOSIS — M2550 Pain in unspecified joint: Secondary | ICD-10-CM | POA: Diagnosis not present

## 2018-12-07 DIAGNOSIS — M545 Low back pain: Secondary | ICD-10-CM | POA: Diagnosis not present

## 2018-12-07 DIAGNOSIS — E538 Deficiency of other specified B group vitamins: Secondary | ICD-10-CM | POA: Diagnosis not present

## 2018-12-07 DIAGNOSIS — G4733 Obstructive sleep apnea (adult) (pediatric): Secondary | ICD-10-CM | POA: Diagnosis not present

## 2018-12-07 DIAGNOSIS — I10 Essential (primary) hypertension: Secondary | ICD-10-CM | POA: Diagnosis not present

## 2018-12-30 DIAGNOSIS — G8929 Other chronic pain: Secondary | ICD-10-CM | POA: Diagnosis not present

## 2018-12-30 DIAGNOSIS — M25561 Pain in right knee: Secondary | ICD-10-CM | POA: Diagnosis not present

## 2018-12-30 DIAGNOSIS — M25461 Effusion, right knee: Secondary | ICD-10-CM | POA: Diagnosis not present

## 2018-12-30 DIAGNOSIS — M1711 Unilateral primary osteoarthritis, right knee: Secondary | ICD-10-CM | POA: Diagnosis not present

## 2020-01-24 DIAGNOSIS — G8929 Other chronic pain: Secondary | ICD-10-CM | POA: Insufficient documentation

## 2021-03-05 DIAGNOSIS — I471 Supraventricular tachycardia: Secondary | ICD-10-CM | POA: Insufficient documentation

## 2021-07-08 ENCOUNTER — Ambulatory Visit (INDEPENDENT_AMBULATORY_CARE_PROVIDER_SITE_OTHER): Payer: Medicare Other | Admitting: Internal Medicine

## 2021-07-08 VITALS — BP 93/62 | HR 62 | Resp 16 | Ht 70.0 in | Wt 252.0 lb

## 2021-07-08 DIAGNOSIS — E669 Obesity, unspecified: Secondary | ICD-10-CM

## 2021-07-08 DIAGNOSIS — I10 Essential (primary) hypertension: Secondary | ICD-10-CM

## 2021-07-08 DIAGNOSIS — Z7189 Other specified counseling: Secondary | ICD-10-CM | POA: Diagnosis not present

## 2021-07-08 DIAGNOSIS — G4733 Obstructive sleep apnea (adult) (pediatric): Secondary | ICD-10-CM | POA: Diagnosis not present

## 2021-07-08 DIAGNOSIS — Z9989 Dependence on other enabling machines and devices: Secondary | ICD-10-CM

## 2021-07-08 NOTE — Patient Instructions (Signed)

## 2021-07-08 NOTE — Progress Notes (Signed)
Sleep Medicine  ? ?Office Visit ? ?Patient Name: [name] ?DOB: Aug 26, 1950 ?MRN [id number] ? ? ? ?Chief Complaint: establish care for OSA ? ?Brief History: ? ?Paublo presents for initial consult to establish care and  states he has a  20 + year history on PAP therapy. Medical history of memory loss, hypertension, hyperlipidemia, obesity, depression, OSA syndrome. Sleep quality is poor most night. Patient is using PAP nightly  with large P10 pillows mask.  The patient relates the following symptoms: memory loss after  he had procedure and has not fully returned (family history of dementia, was R/o'd),  fatigue, excessive daytime sleepiness most days present. The patient goes to sleep at 10pm and wakes up at 9am.  Patient has noted no restlessness of his legs at night.  The patient  relates no unusual behavior during the night.  The patient reports a history of psychiatric problems. The Epworth Sleepiness Score is 17 out of 24 .  The patient relates  Cardiovascular risk factors include: hypertension.   ? ? ? ?ROS ? ?General: (-) fever, (-) chills, (-) night sweat ?Nose and Sinuses: (-) nasal stuffiness or itchiness, (-) postnasal drip, (-) nosebleeds, (-) sinus trouble. ?Mouth and Throat: (-) sore throat, (-) hoarseness. ?Neck: (-) swollen glands, (-) enlarged thyroid, (-) neck pain. ?Respiratory: - cough, - shortness of breath, - wheezing. ?Neurologic: - numbness, - tingling. ?Psychiatric: - anxiety, +depression ?Sleep behavior: -sleep paralysis -hypnogogic hallucinations -dream enactment  ?    -vivid dreams -cataplexy -night terrors -sleep walking ? ? ?Current Medication: ?Outpatient Encounter Medications as of 07/08/2021  ?Medication Sig  ? allopurinol (ZYLOPRIM) 100 MG tablet Take 1 tablet by mouth daily with breakfast.  ? azelastine (ASTELIN) 0.1 % nasal spray Place into the nose.  ? B Complex Vitamins (VITAMIN B COMPLEX) TABS Take 1 tablet by mouth daily.  ? cyclobenzaprine (FLEXERIL) 10 MG tablet Take by  mouth.  ? losartan (COZAAR) 50 MG tablet Take by mouth.  ? metoprolol succinate (TOPROL-XL) 25 MG 24 hr tablet Take 0.5 tablets by mouth daily.  ? simvastatin (ZOCOR) 40 MG tablet Take 1 tablet by mouth at bedtime.  ? thiamine 100 MG tablet Take 1 tablet by mouth daily.  ? allopurinol (ZYLOPRIM) 100 MG tablet Take 100 mg by mouth 2 (two) times daily.   ? amitriptyline (ELAVIL) 25 MG tablet Take 25-50 mg by mouth at bedtime.   ? azelastine (ASTELIN) 0.1 % nasal spray Place 2 sprays into both nostrils 2 (two) times daily.   ? buPROPion (WELLBUTRIN XL) 150 MG 24 hr tablet Take 150 mg by mouth 2 (two) times daily.  ? celecoxib (CELEBREX) 100 MG capsule Take 100 mg by mouth daily.  ? Cholecalciferol 25 MCG (1000 UT) capsule Take by mouth.  ? cyanocobalamin (,VITAMIN B-12,) 1000 MCG/ML injection Inject 1 mL (1,000 mcg total) into the muscle once a week.  ? cyanocobalamin 1000 MCG tablet Take 1 tablet by mouth daily.  ? DULoxetine (CYMBALTA) 60 MG capsule Take 60 mg by mouth daily.  ? famotidine (PEPCID) 20 MG tablet Take 20 mg by mouth 2 (two) times daily.  ? furosemide (LASIX) 20 MG tablet Take 20 mg by mouth daily.  ? latanoprost (XALATAN) 0.005 % ophthalmic solution Place 1 drop into the left eye 2 (two) times daily.  ? loratadine (CLARITIN) 10 MG tablet Take 10 mg by mouth daily.  ? losartan (COZAAR) 50 MG tablet Take 50 mg by mouth daily.   ? ondansetron (ZOFRAN-ODT) 4 MG  disintegrating tablet Take 4 mg by mouth every 8 (eight) hours as needed.  ? pantoprazole (PROTONIX) 40 MG tablet Take 40 mg by mouth daily.  ? pindolol (VISKEN) 5 MG tablet Take 0.5 tablets (2.5 mg total) by mouth 2 (two) times daily.  ? predniSONE (DELTASONE) 5 MG tablet Take 10 mg by mouth daily.  ? ranitidine (ZANTAC) 150 MG capsule Take 150 mg by mouth daily.  ? simethicone (MYLICON) 80 MG chewable tablet Chew 2 tablets (160 mg total) by mouth 4 (four) times daily as needed for flatulence.  ? simvastatin (ZOCOR) 40 MG tablet Take 40 mg by mouth  daily.  ? thiamine (B-1) 100 MG/ML injection Inject 5 mLs (500 mg total) into the muscle daily.  ? timolol (TIMOPTIC) 0.5 % ophthalmic solution Place 1 drop into the left eye 2 (two) times daily.   ? ?No facility-administered encounter medications on file as of 07/08/2021.  ? ? ?Surgical History: ?Past Surgical History:  ?Procedure Laterality Date  ? GASTRIC BYPASS  2008  ? due to ulcer  ? HERNIA REPAIR    ? ventral  ? INGUINAL HERNIA REPAIR Left   ? MASTECTOMY Right   ? SHOULDER SURGERY Right   ? replacement  ? SMALL BOWEL REPAIR  2012  ? for ulcer   ? ? ?Medical History: ?Past Medical History:  ?Diagnosis Date  ? Anemia, unspecified   ? BPH (benign prostatic hyperplasia)   ? Bradycardia   ? Celiac disease   ? Chronic sinusitis   ? DVT (deep venous thrombosis) ([place])   ? after gastric bypass surgery  ? Enlarged prostate   ? GERD (gastroesophageal reflux disease)   ? Glaucoma   ? Gout   ? Hernia of abdominal cavity   ? Hypercholesteremia   ? Hyperlipemia   ? Hypertension   ? Lactose intolerance   ? Major depressive disorder   ? Malignant neoplasm of prostate ([place])   ? Neuropathy   ? OSA (obstructive sleep apnea)   ? Osteoarthritis of right knee   ? Peptic ulcer   ? Polyneuropathy   ? Postgastric surgery syndromes   ? Tachycardia   ? ? ?Family History: ?Non contributory to the present illness ? ?Social History: ?Social History  ? ?Socioeconomic History  ? Marital status: Married  ?  Spouse name: Not on file  ? Number of children: 1  ? Years of education: Masters  ? Highest education level: Not on file  ?Occupational History  ? Occupation: retired  ?Tobacco Use  ? Smoking status: Former  ?  Types: Cigarettes  ?  Quit date: 02/25/1980  ?  Years since quitting: 41.3  ? Smokeless tobacco: Never  ?Substance and Sexual Activity  ? Alcohol use: No  ?  Alcohol/week: 0.0 standard drinks  ? Drug use: No  ? Sexual activity: Yes  ?Other Topics Concern  ? Not on file  ?Social History Narrative  ? No living will  ? Wife, then daughter  should be health care POA  ? Would accept resuscitation  ? Not sure about tube feeds  ? ?Social Determinants of Health  ? ?Financial Resource Strain: Not on file  ?Food Insecurity: Not on file  ?Transportation Needs: Not on file  ?Physical Activity: Not on file  ?Stress: Not on file  ?Social Connections: Not on file  ?Intimate Partner Violence: Not on file  ? ? ?Vital Signs: ?Blood pressure 93/62, pulse 62, resp. rate 16, height '5\' 10"'$  (1.778 m), weight 252 lb (114.3 kg),  SpO2 96 %. ?Body mass index is 36.16 kg/m?.  ? ?Examination: ?General Appearance: The patient is well-developed, well-nourished, and in no distress. ?Neck Circumference: 43 cm ?Skin: Gross inspection of skin unremarkable. ?Head: normocephalic, no gross deformities. ?Eyes: no gross deformities noted. ?ENT: ears appear grossly normal ?Neurologic: Alert and oriented. No involuntary movements. ? ? ? ?EPWORTH SLEEPINESS SCALE: ? ?Scale:  ?(0)= no chance of dozing; (1)= slight chance of dozing; (2)= moderate chance of dozing; (3)= high chance of dozing ? ?Chance  Situtation ?   ?Sitting and reading: 2 ?  ? Watching TV: 2 ?   ?Sitting Inactive in public: 3 ?   ?As a passenger in car: 3   ?   ?Lying down to rest: 3 ?   ?Sitting and talking: 1 ?   ?Sitting quielty after lunch: 2 ?   ?In a car, stopped in traffic: 1 ? ? ?TOTAL SCORE:   17 out of 24 ? ? ? ?SLEEP STUDIES: ? ?No studies on file ? ?CPAP COMPLIANCE DATA: ? ?Date Range: 06/08/21  - 07/07/21 ? ?Average Daily Use: 8:43 hours ? ?Median Use: 8:46 hours ? ?Compliance for > 4 Hours: 100% ? ?AHI: 1.8 respiratory events per hour ? ?Days Used: 30/30 ? ?Mask Leak: 10.8 lpm ? ?95th Percentile Pressure: 8 cmH2O, epr 3-fulltime ? ? ?LABS: ?No results found for this or any previous visit (from the past 2160 hour(s)). ? ?Radiology: ?DG Chest 1 View ? ?Result Date: 06/09/2017 ?CLINICAL DATA:  Dizziness and nausea. EXAM: CHEST  1 VIEW COMPARISON:  Chest x-ray dated March 16, 2007. FINDINGS: The heart size and  mediastinal contours are within normal limits. Normal pulmonary vascularity. No focal consolidation, pleural effusion, or pneumothorax. No acute osseous abnormality. Interval right shoulder arthroplasty, pa

## 2021-10-07 ENCOUNTER — Ambulatory Visit (INDEPENDENT_AMBULATORY_CARE_PROVIDER_SITE_OTHER): Payer: Medicare Other | Admitting: Internal Medicine

## 2021-10-07 VITALS — BP 140/76 | HR 56 | Resp 16 | Ht 71.0 in | Wt 255.8 lb

## 2021-10-07 DIAGNOSIS — G4733 Obstructive sleep apnea (adult) (pediatric): Secondary | ICD-10-CM | POA: Diagnosis not present

## 2021-10-07 DIAGNOSIS — I10 Essential (primary) hypertension: Secondary | ICD-10-CM | POA: Diagnosis not present

## 2021-10-07 DIAGNOSIS — Z7189 Other specified counseling: Secondary | ICD-10-CM | POA: Diagnosis not present

## 2021-10-07 DIAGNOSIS — Z9989 Dependence on other enabling machines and devices: Secondary | ICD-10-CM

## 2021-10-07 NOTE — Patient Instructions (Signed)

## 2021-10-07 NOTE — Progress Notes (Signed)
[facility] [place], [place] [address]  Pulmonary Sleep Medicine   Office Visit Note  Patient Name: [name] DOB: Apr 19, 1950 MRN [id number]    Chief Complaint: Obstructive Sleep Apnea visit  Brief History:  [name] is seen today for CPAP@ 8 cmH2O after being set up on a new machine. The patient has a 20+ year history of sleep apnea. Patient is using PAP nightly.  The patient feels rested after sleeping with PAP.  The patient reports benefiting from PAP use. Reported sleepiness is  improved and the Epworth Sleepiness Score is 10 out of 24. The patient does not take naps. The patient complains of the following: He has had some issues with the mask, we discussed adjusting the headgear.  The compliance download shows 94% compliance with an average use time of 8 hours 17 minutes. The AHI is 1.7  The patient does not complain of limb movements disrupting sleep. The patient continues to require PAP therapy as a medical necessity in order to eliminate his sleep apnea.   ROS  General: (-) fever, (-) chills, (-) night sweat Nose and Sinuses: (-) nasal stuffiness or itchiness, (-) postnasal drip, (-) nosebleeds, (-) sinus trouble. Mouth and Throat: (-) sore throat, (-) hoarseness. Neck: (-) swollen glands, (-) enlarged thyroid, (-) neck pain. Respiratory: - cough, - shortness of breath, - wheezing. Neurologic: - numbness, +tingling. Psychiatric: - anxiety, - depression   Current Medication: Outpatient Encounter Medications as of 10/07/2021  Medication Sig   allopurinol (ZYLOPRIM) 100 MG tablet Take 100 mg by mouth 2 (two) times daily.    allopurinol (ZYLOPRIM) 100 MG tablet Take 1 tablet by mouth daily with breakfast.   amitriptyline (ELAVIL) 25 MG tablet Take 25-50 mg by mouth at bedtime.    azelastine (ASTELIN) 0.1 % nasal spray Place 2 sprays into both nostrils 2 (two) times daily.    azelastine (ASTELIN) 0.1 % nasal spray Place into the nose.   B Complex  Vitamins (VITAMIN B COMPLEX) TABS Take 1 tablet by mouth daily.   buPROPion (WELLBUTRIN XL) 150 MG 24 hr tablet Take 150 mg by mouth 2 (two) times daily.   celecoxib (CELEBREX) 100 MG capsule Take 100 mg by mouth daily.   Cholecalciferol 25 MCG (1000 UT) capsule Take by mouth.   cyanocobalamin (,VITAMIN B-12,) 1000 MCG/ML injection Inject 1 mL (1,000 mcg total) into the muscle once a week.   cyanocobalamin 1000 MCG tablet Take 1 tablet by mouth daily.   cyclobenzaprine (FLEXERIL) 10 MG tablet Take by mouth.   DULoxetine (CYMBALTA) 60 MG capsule Take 60 mg by mouth daily.   famotidine (PEPCID) 20 MG tablet Take 20 mg by mouth 2 (two) times daily.   furosemide (LASIX) 20 MG tablet Take 20 mg by mouth daily.   latanoprost (XALATAN) 0.005 % ophthalmic solution Place 1 drop into the left eye 2 (two) times daily.   loratadine (CLARITIN) 10 MG tablet Take 10 mg by mouth daily.   losartan (COZAAR) 50 MG tablet Take 50 mg by mouth daily.    losartan (COZAAR) 50 MG tablet Take by mouth.   metoprolol succinate (TOPROL-XL) 25 MG 24 hr tablet Take 0.5 tablets by mouth daily.   ondansetron (ZOFRAN-ODT) 4 MG disintegrating tablet Take 4 mg by mouth every 8 (eight) hours as needed.   pantoprazole (PROTONIX) 40 MG tablet Take 40 mg by mouth daily.   pindolol (VISKEN) 5 MG tablet Take 0.5 tablets (2.5 mg total) by mouth 2 (two) times  daily.   predniSONE (DELTASONE) 5 MG tablet Take 10 mg by mouth daily.   ranitidine (ZANTAC) 150 MG capsule Take 150 mg by mouth daily.   simethicone (MYLICON) 80 MG chewable tablet Chew 2 tablets (160 mg total) by mouth 4 (four) times daily as needed for flatulence.   simvastatin (ZOCOR) 40 MG tablet Take 40 mg by mouth daily.   simvastatin (ZOCOR) 40 MG tablet Take 1 tablet by mouth at bedtime.   thiamine (B-1) 100 MG/ML injection Inject 5 mLs (500 mg total) into the muscle daily.   thiamine 100 MG tablet Take 1 tablet by mouth daily.   timolol (TIMOPTIC) 0.5 % ophthalmic  solution Place 1 drop into the left eye 2 (two) times daily.    No facility-administered encounter medications on file as of 10/07/2021.    Surgical History: Past Surgical History:  Procedure Laterality Date   GASTRIC BYPASS  2008   due to ulcer   HERNIA REPAIR     ventral   INGUINAL HERNIA REPAIR Left    MASTECTOMY Right    SHOULDER SURGERY Right    replacement   SMALL BOWEL REPAIR  2012   for ulcer     Medical History: Past Medical History:  Diagnosis Date   Anemia, unspecified    BPH (benign prostatic hyperplasia)    Bradycardia    Celiac disease    Chronic sinusitis    DVT (deep venous thrombosis) (HCC)    after gastric bypass surgery   Enlarged prostate    GERD (gastroesophageal reflux disease)    Glaucoma    Gout    Hernia of abdominal cavity    Hypercholesteremia    Hyperlipemia    Hypertension    Lactose intolerance    Major depressive disorder    Malignant neoplasm of prostate (HCC)    Neuropathy    OSA (obstructive sleep apnea)    Osteoarthritis of right knee    Peptic ulcer    Polyneuropathy    Postgastric surgery syndromes    Tachycardia     Family History: Non contributory to the present illness  Social History: Social History   Socioeconomic History   Marital status: Married    Spouse name: Not on file   Number of children: 1   Years of education: Masters   Highest education level: Not on file  Occupational History   Occupation: retired  Tobacco Use   Smoking status: Former    Types: Cigarettes    Quit date: 02/25/1980    Years since quitting: 41.6   Smokeless tobacco: Never  Substance and Sexual Activity   Alcohol use: No    Alcohol/week: 0.0 standard drinks of alcohol   Drug use: No   Sexual activity: Yes  Other Topics Concern   Not on file  Social History Narrative   No living will   Wife, then daughter should be health care POA   Would accept resuscitation   Not sure about tube feeds   Social Determinants of Health    Financial Resource Strain: Not on file  Food Insecurity: Not on file  Transportation Needs: Not on file  Physical Activity: Not on file  Stress: Not on file  Social Connections: Not on file  Intimate Partner Violence: Not on file    Vital Signs: Blood pressure (!) 140/76, pulse (!) 56, resp. rate 16, height '5\' 11"'$  (1.803 m), weight 255 lb 12.8 oz (116 kg), SpO2 95 %. Body mass index is 35.68 kg/m.  Examination: General Appearance: The patient is well-developed, well-nourished, and in no distress. Neck Circumference: 43 cm Skin: Gross inspection of skin unremarkable. Head: normocephalic, no gross deformities. Eyes: no gross deformities noted. ENT: ears appear grossly normal Neurologic: Alert and oriented. No involuntary movements.  STOP BANG RISK ASSESSMENT S (snore) Have you been told that you snore?     NO   T (tired) Are you often tired, fatigued, or sleepy during the day?   NO  O (obstruction) Do you stop breathing, choke, or gasp during sleep? NO   P (pressure) Do you have or are you being treated for high blood pressure? YES   B (BMI) Is your body index greater than 35 kg/m? YES   A (age) Are you 17 years old or older? YES   N (neck) Do you have a neck circumference greater than 16 inches?   YES   G (gender) Are you a male? YES   TOTAL STOP/BANG "YES" ANSWERS 5       A STOP-Bang score of 2 or less is considered low risk, and a score of 5 or more is high risk for having either moderate or severe OSA. For people who score 3 or 4, doctors may need to perform further assessment to determine how likely they are to have OSA.         EPWORTH SLEEPINESS SCALE:  Scale:  (0)= no chance of dozing; (1)= slight chance of dozing; (2)= moderate chance of dozing; (3)= high chance of dozing  Chance  Situtation    Sitting and reading: 1    Watching TV: 1    Sitting Inactive in public: 1    As a passenger in car: 2      Lying down to rest: 3    Sitting and  talking: 1    Sitting quielty after lunch: 1    In a car, stopped in traffic: 0   TOTAL SCORE:   10 out of 24    SLEEP STUDIES:  PSG (12/2014 at [place]) AHI 8.5/hr, Supine AHI 34.5/hr, REM AHI 31.1/hr, Min SpO2 76%   CPAP COMPLIANCE DATA:  Date Range: 08/14/21 - 10/06/21  Average Daily Use: 8 hours 17 minutes  Median Use: 8 hours 59 minutes  Compliance for > 4 Hours: 51 days  AHI: 1.7 respiratory events per hour  Days Used: 51/54  Mask Leak: 37.1  95th Percentile Pressure: 8 cmh20         LABS: No results found for this or any previous visit (from the past 2160 hour(s)).  Radiology: MR Brain Wo Contrast  Result Date: 06/09/2017 CLINICAL DATA:  Diplopia and left eye ophthalmoplegia. EXAM: MRI HEAD WITHOUT CONTRAST TECHNIQUE: Multiplanar, multiecho pulse sequences of the brain and surrounding structures were obtained without intravenous contrast. COMPARISON:  06/02/2017 brain MRI FINDINGS: BRAIN: The midline structures are normal. There is no acute infarct or acute hemorrhage. No mass lesion, hydrocephalus, dural abnormality or extra-axial collection. Minimal white matter hyperintensity, nonspecific and commonly seen in asymptomatic patients of this age. Focus of hyperintense T2-weighted signal in the right parietal white matter is favored to be a prominent perivascular space. No age-advanced or lobar predominant atrophy. No chronic microhemorrhage or superficial siderosis. VASCULAR: Major intracranial arterial and venous sinus flow voids are preserved. SKULL AND UPPER CERVICAL SPINE: The visualized skull base, calvarium, upper cervical spine and extracranial soft tissues are normal. SINUSES/ORBITS: No fluid levels or advanced mucosal thickening. No mastoid or middle ear effusion. Normal orbits. IMPRESSION: No  acute intracranial abnormality or causative finding of diplopia. Electronically Signed   By: Ulyses Jarred M.D.   On: 06/09/2017 22:48   CT ABDOMEN PELVIS  W CONTRAST  Result Date: 06/09/2017 CLINICAL DATA:  Generalized weakness. Nausea and vomiting for 2 weeks EXAM: CT ABDOMEN AND PELVIS WITH CONTRAST TECHNIQUE: Multidetector CT imaging of the abdomen and pelvis was performed using the standard protocol following bolus administration of intravenous contrast. CONTRAST:  112m ISOVUE-300 IOPAMIDOL (ISOVUE-300) INJECTION 61% COMPARISON:  None. FINDINGS: Lower chest: Lung bases are clear. Hepatobiliary: No focal hepatic lesion. No biliary duct dilatation. Gallbladder is normal. Common bile duct is normal. Pancreas: Pancreas is normal. No ductal dilatation. No pancreatic inflammation. Spleen: Normal spleen Adrenals/urinary tract: Adrenal glands and kidneys are normal. The ureters and bladder normal. Stomach/Bowel: Stomach is normal. There is a gastroenteric anastomosis along the gastric antral region anteriorly. Duodenum and small-bowel are normal without evidence of inflammation or obstruction. There is 2 adjacent midline ventral hernias which contain loops of nonobstructed small bowel. Distal small bowel leading up the terminal ileum is normal. Appendix normal. Ascending, transverse, and descending colon are normal. Vascular/Lymphatic: Abdominal aorta is normal caliber with atherosclerotic calcification. There is no retroperitoneal or periportal lymphadenopathy. No pelvic lymphadenopathy. Reproductive: Post prostatectomy Other: No free fluid.  LEFT inguinal hernia repair. Musculoskeletal: No aggressive osseous lesion. IMPRESSION: 1. Two midline ventral hernias contain loops of nonobstructed small bowel. 2. Postsurgical change in the gastric antrum with gastroenteric anastomosis. No complication. 3. No acute abdominopelvic findings. Electronically Signed   By: SSuzy BouchardM.D.   On: 06/09/2017 22:19   DG Abd 2 Views  Result Date: 06/09/2017 CLINICAL DATA:  Vomiting EXAM: ABDOMEN - 2 VIEW COMPARISON:  None. FINDINGS: Supine and upright images obtained. There is  moderate stool in the colon. There is no evident bowel dilatation or air-fluid level to suggest bowel obstruction. No free air. There is a surgical clip in the medial left upper quadrant. Visualized lung bases are clear. There are is a phlebolith in the lower left pelvis. IMPRESSION: Moderate stool in colon.  No evident bowel obstruction or free air. Electronically Signed   By: WLowella GripIII M.D.   On: 06/09/2017 19:10   DG Chest 1 View  Result Date: 06/09/2017 CLINICAL DATA:  Dizziness and nausea. EXAM: CHEST  1 VIEW COMPARISON:  Chest x-ray dated March 16, 2007. FINDINGS: The heart size and mediastinal contours are within normal limits. Normal pulmonary vascularity. No focal consolidation, pleural effusion, or pneumothorax. No acute osseous abnormality. Interval right shoulder arthroplasty, partially visualized. IMPRESSION: No active disease. Electronically Signed   By: WTitus DubinM.D.   On: 06/09/2017 17:25    No results found.  No results found.    Assessment and Plan: Patient Active Problem List   Diagnosis Date Noted   OSA on CPAP 07/08/2021   CPAP use counseling 07/08/2021   Hypertension 07/08/2021   Obesity (BMI 30-39.9) 07/08/2021   Supraventricular tachycardia ([place] 03/05/2021   Chronic pain of both knees 01/24/2020   OSA (obstructive sleep apnea)    Arthritis due to gout 07/01/2017   H/O gastric bypass 07/01/2017   GERD (gastroesophageal reflux disease) 06/10/2017   HLD (hyperlipidemia) 06/10/2017   Wernicke encephalopathy 06/10/2017   Nausea & vomiting 06/09/2017   Double vision 06/09/2017   Gout    Depression    Discoloration of skin of toe 03/18/2016   [place] 01/14/2016   Tachycardia 03/02/2015   Bradycardia 08/03/2014   Palpitations 06/26/2014  Benign essential HTN 06/26/2014   Fatigue 06/26/2014   1. OSA on CPAP The patient does tolerate PAP and reports  benefit from PAP use. The patient was reminded how to clean equipment and  advised to replace supplies routinely. The patient was also counselled on weight loss. The compliance is very good. The AHI is 1.7.   OSA on cpap- CPAP continues to be medically necessary to treat this patient's OSA.  Continue with very good compliance. F/u one year.   2. CPAP use counseling CPAP Counseling: had a lengthy discussion with the patient regarding the importance of PAP therapy in management of the sleep apnea. Patient appears to understand the risk factor reduction and also understands the risks associated with untreated sleep apnea. Patient will try to make a good faith effort to remain compliant with therapy. Also instructed the patient on proper cleaning of the device including the water must be changed daily if possible and use of distilled water is preferred. Patient understands that the machine should be regularly cleaned with appropriate recommended cleaning solutions that do not damage the PAP machine for example given white vinegar and water rinses. Other methods such as ozone treatment may not be as good as these simple methods to achieve cleaning.   3. Hypertension, unspecified type Hypertension Counseling:   The following hypertensive lifestyle modification were recommended and discussed:  1. Limiting alcohol intake to less than 1 oz/day of ethanol:(24 oz of beer or 8 oz of wine or 2 oz of 100-proof whiskey). 2. Take baby ASA 81 mg daily. 3. Importance of regular aerobic exercise and losing weight. 4. Reduce dietary saturated fat and cholesterol intake for overall cardiovascular health. 5. Maintaining adequate dietary potassium, calcium, and magnesium intake. 6. Regular monitoring of the blood pressure. 7. Reduce sodium intake to less than 100 mmol/day (less than 2.3 gm of sodium or less than 6 gm of sodium choride)      General Counseling: I have discussed the findings of the evaluation and examination with Sherryll Burger.  I have also discussed any further diagnostic evaluation  thatmay be needed or ordered today. Andre verbalizes understanding of the findings of todays visit. We also reviewed his medications today and discussed drug interactions and side effects including but not limited excessive drowsiness and altered mental states. We also discussed that there is always a risk not just to him but also people around him. he has been encouraged to call the office with any questions or concerns that should arise related to todays visit.  No orders of the defined types were placed in this encounter.       I have personally obtained a history, examined the patient, evaluated laboratory and imaging results, formulated the assessment and plan and placed orders. This patient was seen today by Tressie Ellis, PA-C in collaboration with Dr. Devona Konig.   Allyne Gee, MD [facility] [place] Diplomate ABMS Pulmonary Critical Care Medicine and Sleep Medicine

## 2023-03-29 NOTE — Progress Notes (Unsigned)
[facility] [address] [place], [place] [address]  Pulmonary Sleep Medicine   Office Visit Note  Patient Name: [name] DOB: 1950/12/25 MRN [id number]    Chief Complaint: Obstructive Sleep Apnea visit  Brief History:  [name] is seen today for an annual follow up visit for CPAP@ 8 cmH2O. The patient has a 21+ year history of sleep apnea. Patient is using PAP nightly.  The patient feels rested after sleeping with PAP.  The patient reports benefiting from PAP use. Reported sleepiness is  improved  and the Epworth Sleepiness Score is 13 out of 24. The patient will occasionally take naps. The patient complains of the following: some headaches, some bloating of the stomach and increased burping.  The compliance download shows 98% compliance with an average use time of 8 hours 40 minutes. The AHI is 1.5.  The patient does not complain of limb movements disrupting sleep. The patient continues to require PAP therapy in order to eliminate sleep apnea.   ROS  General: (-) fever, (-) chills, (-) night sweat Nose and Sinuses: (-) nasal stuffiness or itchiness, (-) postnasal drip, (-) nosebleeds, (-) sinus trouble. Mouth and Throat: (-) sore throat, (-) hoarseness. Neck: (-) swollen glands, (-) enlarged thyroid, (-) neck pain. Respiratory: - cough, + shortness of breath, - wheezing. Neurologic: - numbness, - tingling. Psychiatric: + anxiety, + depression   Current Medication: Outpatient Encounter Medications as of 03/30/2023  Medication Sig   memantine (NAMENDA) 10 MG tablet Take by mouth.   allopurinol (ZYLOPRIM) 100 MG tablet Take 100 mg by mouth 2 (two) times daily.    amitriptyline (ELAVIL) 25 MG tablet Take 25-50 mg by mouth at bedtime.    azelastine (ASTELIN) 0.1 % nasal spray Place 2 sprays into both nostrils 2 (two) times daily.    B Complex Vitamins (VITAMIN B COMPLEX) TABS Take 1 tablet by mouth daily.   buPROPion (WELLBUTRIN XL) 150 MG 24 hr tablet Take 150 mg  by mouth 2 (two) times daily.   celecoxib (CELEBREX) 100 MG capsule Take 100 mg by mouth daily.   Cholecalciferol 25 MCG (1000 UT) capsule Take by mouth.   cyanocobalamin (,VITAMIN B-12,) 1000 MCG/ML injection Inject 1 mL (1,000 mcg total) into the muscle once a week.   cyanocobalamin 1000 MCG tablet Take 1 tablet by mouth daily.   cyclobenzaprine (FLEXERIL) 10 MG tablet Take by mouth.   DULoxetine (CYMBALTA) 60 MG capsule Take 60 mg by mouth daily.   famotidine (PEPCID) 20 MG tablet Take 20 mg by mouth 2 (two) times daily.   furosemide (LASIX) 20 MG tablet Take 20 mg by mouth daily.   latanoprost (XALATAN) 0.005 % ophthalmic solution Place 1 drop into the left eye 2 (two) times daily.   loratadine (CLARITIN) 10 MG tablet Take 10 mg by mouth daily.   losartan (COZAAR) 50 MG tablet Take 50 mg by mouth daily.    ondansetron (ZOFRAN-ODT) 4 MG disintegrating tablet Take 4 mg by mouth every 8 (eight) hours as needed.   pantoprazole (PROTONIX) 40 MG tablet Take 40 mg by mouth daily.   predniSONE (DELTASONE) 5 MG tablet Take 10 mg by mouth daily.   simethicone (MYLICON) 80 MG chewable tablet Chew 2 tablets (160 mg total) by mouth 4 (four) times daily as needed for flatulence.   simvastatin (ZOCOR) 40 MG tablet Take 40 mg by mouth daily.   thiamine 100 MG tablet Take 1 tablet by mouth daily.   timolol (TIMOPTIC) 0.5 % ophthalmic  solution Place 1 drop into the left eye 2 (two) times daily.    [DISCONTINUED] allopurinol (ZYLOPRIM) 100 MG tablet Take 1 tablet by mouth daily with breakfast.   [DISCONTINUED] azelastine (ASTELIN) 0.1 % nasal spray Place into the nose.   [DISCONTINUED] losartan (COZAAR) 50 MG tablet Take by mouth.   [DISCONTINUED] pindolol (VISKEN) 5 MG tablet Take 0.5 tablets (2.5 mg total) by mouth 2 (two) times daily.   [DISCONTINUED] ranitidine (ZANTAC) 150 MG capsule Take 150 mg by mouth daily.   [DISCONTINUED] simvastatin (ZOCOR) 40 MG tablet Take 1 tablet by mouth at bedtime.    [DISCONTINUED] thiamine (B-1) 100 MG/ML injection Inject 5 mLs (500 mg total) into the muscle daily.   No facility-administered encounter medications on file as of 03/30/2023.    Surgical History: Past Surgical History:  Procedure Laterality Date   GASTRIC BYPASS  2008   due to ulcer   HERNIA REPAIR     ventral   INGUINAL HERNIA REPAIR Left    MASTECTOMY Right    SHOULDER SURGERY Right    replacement   SMALL BOWEL REPAIR  2012   for ulcer     Medical History: Past Medical History:  Diagnosis Date   Anemia, unspecified    BPH (benign prostatic hyperplasia)    Bradycardia    Celiac disease    Chronic sinusitis    DVT (deep venous thrombosis) (HCC)    after gastric bypass surgery   Enlarged prostate    GERD (gastroesophageal reflux disease)    Glaucoma    Gout    Hernia of abdominal cavity    Hypercholesteremia    Hyperlipemia    Hypertension    Lactose intolerance    Major depressive disorder    Malignant neoplasm of prostate (HCC)    Neuropathy    OSA (obstructive sleep apnea)    Osteoarthritis of right knee    Peptic ulcer    Polyneuropathy    Postgastric surgery syndromes    Tachycardia     Family History: Non contributory to the present illness  Social History: Social History   Socioeconomic History   Marital status: Married    Spouse name: Not on file   Number of children: 1   Years of education: Masters   Highest education level: Not on file  Occupational History   Occupation: retired  Tobacco Use   Smoking status: Former    Current packs/day: 0.00    Types: Cigarettes    Quit date: 02/25/1980    Years since quitting: 43.1   Smokeless tobacco: Never  Substance and Sexual Activity   Alcohol use: No    Alcohol/week: 0.0 standard drinks of alcohol   Drug use: No   Sexual activity: Yes  Other Topics Concern   Not on file  Social History Narrative   No living will   Wife, then daughter should be health care POA   Would accept resuscitation    Not sure about tube feeds   Social Drivers of Health   Financial Resource Strain: Low Risk  (07/23/2022)   Received from [facility]   Overall Financial Resource Strain (CARDIA)    Difficulty of Paying Living Expenses: Not hard at all  Food Insecurity: No Food Insecurity (07/23/2022)   Received from [facility]   Hunger Vital Sign    Worried About Running Out of Food in the Last Year: Never true    Ran Out of Food in the Last Year: Never true  Transportation  Needs: No Transportation Needs (07/23/2022)   Received from [facility] - Transportation    Lack of Transportation (Medical): No    Lack of Transportation (Non-Medical): No  Physical Activity: Insufficiently Active (07/23/2022)   Received from [facility]   Exercise Vital Sign    Days of Exercise per Week: 4 days    Minutes of Exercise per Session: 30 min  Stress: No Stress Concern Present (07/23/2022)   Received from [facility] - Occupational Stress Questionnaire    Feeling of Stress : Not at all  Social Connections: Socially Integrated (07/23/2022)   Received from [facility]   Social Connection and Isolation Panel [NHANES]    Frequency of Communication with Friends and Family: More than three times a week    Frequency of Social Gatherings with Friends and Family: More than three times a week    Attends Religious Services: More than 4 times per year    Active Member of [employer] or Organizations: Yes    Attends Engineer, structural: More than 4 times per year    Marital Status: Married  Catering manager Violence: Not At Risk (07/23/2022)   Received from [facility]   Humiliation, Afraid, Rape, and Kick questionnaire    Fear of Current or Ex-Partner: No    Emotionally Abused: No    Physically Abused: No    Sexually Abused: No    Vital Signs: Blood pressure 138/76, pulse 78, resp. rate 16, height 5\' 10"  (1.778 m), weight 247 lb (112  kg), SpO2 96%. Body mass index is 35.44 kg/m.    Examination: General Appearance: The patient is well-developed, well-nourished, and in no distress. Neck Circumference: 43 cm Skin: Gross inspection of skin unremarkable. Head: normocephalic, no gross deformities. Eyes: no gross deformities noted. ENT: ears appear grossly normal Neurologic: Alert and oriented. No involuntary movements.  STOP BANG RISK ASSESSMENT S (snore) Have you been told that you snore?     NO   T (tired) Are you often tired, fatigued, or sleepy during the day?   YES  O (obstruction) Do you stop breathing, choke, or gasp during sleep? NO   P (pressure) Do you have or are you being treated for high blood pressure? YES   B (BMI) Is your body index greater than 35 kg/m? YES   A (age) Are you 55 years old or older? YES   N (neck) Do you have a neck circumference greater than 16 inches?   YES   G (gender) Are you a male? YES   TOTAL STOP/BANG "YES" ANSWERS 6       A STOP-Bang score of 2 or less is considered low risk, and a score of 5 or more is high risk for having either moderate or severe OSA. For people who score 3 or 4, doctors may need to perform further assessment to determine how likely they are to have OSA.         EPWORTH SLEEPINESS SCALE:  Scale:  (0)= no chance of dozing; (1)= slight chance of dozing; (2)= moderate chance of dozing; (3)= high chance of dozing  Chance  Situtation    Sitting and reading: 2    Watching TV: 2    Sitting Inactive in public: 1    As a passenger in car: 2      Lying down to rest: 3    Sitting and talking: 1  Sitting quielty after lunch: 2    In a car, stopped in traffic: 0   TOTAL SCORE:   13 out of 24    SLEEP STUDIES:  PSG (12/2014 at [facility]) AHI 8.5/hr, Supin AHI 34.5/hr, REM AHI 31.1/hr  SP02 min 76%   CPAP COMPLIANCE DATA:  Date Range: 03/28/2022-03/27/2023  Average Daily Use: 8 hours 40 minutes   Median Use: 8 hours 40  minutes  Compliance for > 4 Hours: 98%  AHI: 1.5 respiratory events per hour  Days Used: 356/365 days  Mask Leak: 1.5  95th Percentile Pressure: 8         LABS: No results found for this or any previous visit (from the past 2160 hours).  Radiology: MR Brain Wo Contrast Result Date: 06/09/2017 CLINICAL DATA:  Diplopia and left eye ophthalmoplegia. EXAM: MRI HEAD WITHOUT CONTRAST TECHNIQUE: Multiplanar, multiecho pulse sequences of the brain and surrounding structures were obtained without intravenous contrast. COMPARISON:  06/02/2017 brain MRI FINDINGS: BRAIN: The midline structures are normal. There is no acute infarct or acute hemorrhage. No mass lesion, hydrocephalus, dural abnormality or extra-axial collection. Minimal white matter hyperintensity, nonspecific and commonly seen in asymptomatic patients of this age. Focus of hyperintense T2-weighted signal in the right parietal white matter is favored to be a prominent perivascular space. No age-advanced or lobar predominant atrophy. No chronic microhemorrhage or superficial siderosis. VASCULAR: Major intracranial arterial and venous sinus flow voids are preserved. SKULL AND UPPER CERVICAL SPINE: The visualized skull base, calvarium, upper cervical spine and extracranial soft tissues are normal. SINUSES/ORBITS: No fluid levels or advanced mucosal thickening. No mastoid or middle ear effusion. Normal orbits. IMPRESSION: No acute intracranial abnormality or causative finding of diplopia. Electronically Signed   By: Deatra Robinson M.D.   On: 06/09/2017 22:48   CT ABDOMEN PELVIS W CONTRAST Result Date: 06/09/2017 CLINICAL DATA:  Generalized weakness. Nausea and vomiting for 2 weeks EXAM: CT ABDOMEN AND PELVIS WITH CONTRAST TECHNIQUE: Multidetector CT imaging of the abdomen and pelvis was performed using the standard protocol following bolus administration of intravenous contrast. CONTRAST:  ISOVUE-300 IOPAMIDOL (ISOVUE-300) INJECTION 61%  COMPARISON:  None. FINDINGS: Lower chest: Lung bases are clear. Hepatobiliary: No focal hepatic lesion. No biliary duct dilatation. Gallbladder is normal. Common bile duct is normal. Pancreas: Pancreas is normal. No ductal dilatation. No pancreatic inflammation. Spleen: Normal spleen Adrenals/urinary tract: Adrenal glands and kidneys are normal. The ureters and bladder normal. Stomach/Bowel: Stomach is normal. There is a gastroenteric anastomosis along the gastric antral region anteriorly. Duodenum and small-bowel are normal without evidence of inflammation or obstruction. There is 2 adjacent midline ventral hernias which contain loops of nonobstructed small bowel. Distal small bowel leading up the terminal ileum is normal. Appendix normal. Ascending, transverse, and descending colon are normal. Vascular/Lymphatic: Abdominal aorta is normal caliber with atherosclerotic calcification. There is no retroperitoneal or periportal lymphadenopathy. No pelvic lymphadenopathy. Reproductive: Post prostatectomy Other: No free fluid.  LEFT inguinal hernia repair. Musculoskeletal: No aggressive osseous lesion. IMPRESSION: 1. Two midline ventral hernias contain loops of nonobstructed small bowel. 2. Postsurgical change in the gastric antrum with gastroenteric anastomosis. No complication. 3. No acute abdominopelvic findings. Electronically Signed   By: Genevive Bi M.D.   On: 06/09/2017 22:19   DG Abd 2 Views Result Date: 06/09/2017 CLINICAL DATA:  Vomiting EXAM: ABDOMEN - 2 VIEW COMPARISON:  None. FINDINGS: Supine and upright images obtained. There is moderate stool in the colon. There is no evident bowel dilatation or air-fluid level to  suggest bowel obstruction. No free air. There is a surgical clip in the medial left upper quadrant. Visualized lung bases are clear. There are is a phlebolith in the lower left pelvis. IMPRESSION: Moderate stool in colon.  No evident bowel obstruction or free air. Electronically Signed    By: Bretta Bang III M.D.   On: 06/09/2017 19:10   DG Chest 1 View Result Date: 06/09/2017 CLINICAL DATA:  Dizziness and nausea. EXAM: CHEST  1 VIEW COMPARISON:  Chest x-ray dated March 16, 2007. FINDINGS: The heart size and mediastinal contours are within normal limits. Normal pulmonary vascularity. No focal consolidation, pleural effusion, or pneumothorax. No acute osseous abnormality. Interval right shoulder arthroplasty, partially visualized. IMPRESSION: No active disease. Electronically Signed   By: Obie Dredge M.D.   On: 06/09/2017 17:25    No results found.  No results found.    Assessment and Plan: Patient Active Problem List   Diagnosis Date Noted   OSA on CPAP 07/08/2021   CPAP use counseling 07/08/2021   Hypertension 07/08/2021   Obesity (BMI 30-39.9) 07/08/2021   Supraventricular tachycardia (HCC) 03/05/2021   Chronic pain of both knees 01/24/2020   OSA (obstructive sleep apnea)    Arthritis due to gout 07/01/2017   H/O gastric bypass 07/01/2017   GERD (gastroesophageal reflux disease) 06/10/2017   HLD (hyperlipidemia) 06/10/2017   Wernicke encephalopathy 06/10/2017   Nausea & vomiting 06/09/2017   Double vision 06/09/2017   Gout    Depression    Discoloration of skin of toe 03/18/2016   Cancer of prostate (HCC) 01/14/2016   Tachycardia 03/02/2015   Bradycardia 08/03/2014   Palpitations 06/26/2014   Benign essential HTN 06/26/2014   Fatigue 06/26/2014   1. OSA on CPAP (Primary) The patient does tolerate PAP and reports  benefit from PAP use. The patient was reminded how to clean equipment and advised to replace supplies routinely. The patient was also counselled on weight loss. The compliance is eccellent. The AHI is 1.5.   OSA on cpap- controlled. Continue with excellent compliance with pap. CPAP continues to be medically necessary to treat this patient's OSA. F/u one year.    2. CPAP use counseling CPAP Counseling: had a lengthy discussion with  the patient regarding the importance of PAP therapy in management of the sleep apnea. Patient appears to understand the risk factor reduction and also understands the risks associated with untreated sleep apnea. Patient will try to make a good faith effort to remain compliant with therapy. Also instructed the patient on proper cleaning of the device including the water must be changed daily if possible and use of distilled water is preferred. Patient understands that the machine should be regularly cleaned with appropriate recommended cleaning solutions that do not damage the PAP machine for example given white vinegar and water rinses. Other methods such as ozone treatment may not be as good as these simple methods to achieve cleaning.   3. Hypertension, unspecified type Controlled with losartan, continue.      General Counseling: I have discussed the findings of the evaluation and examination with [name].  I have also discussed any further diagnostic evaluation thatmay be needed or ordered today. [name] verbalizes understanding of the findings of todays visit. We also reviewed his medications today and discussed drug interactions and side effects including but not limited excessive drowsiness and altered mental states. We also discussed that there is always a risk not just to him but also people around him. he has been encouraged to  call the office with any questions or concerns that should arise related to todays visit.  No orders of the defined types were placed in this encounter.       I have personally obtained a history, examined the patient, evaluated laboratory and imaging results, formulated the assessment and plan and placed orders. This patient was seen today by Emmaline Kluver, PA-C in collaboration with Dr. Freda Munro.   Yevonne Pax, MD [facility] Diplomate ABMS Pulmonary Critical Care Medicine and Sleep Medicine

## 2023-03-30 ENCOUNTER — Ambulatory Visit (INDEPENDENT_AMBULATORY_CARE_PROVIDER_SITE_OTHER): Payer: Medicare Other | Admitting: Internal Medicine

## 2023-03-30 VITALS — BP 138/76 | HR 78 | Resp 16 | Ht 70.0 in | Wt 247.0 lb

## 2023-03-30 DIAGNOSIS — Z7189 Other specified counseling: Secondary | ICD-10-CM | POA: Diagnosis not present

## 2023-03-30 DIAGNOSIS — I10 Essential (primary) hypertension: Secondary | ICD-10-CM | POA: Diagnosis not present

## 2023-03-30 DIAGNOSIS — G4733 Obstructive sleep apnea (adult) (pediatric): Secondary | ICD-10-CM | POA: Diagnosis not present

## 2023-03-30 NOTE — Patient Instructions (Signed)

## 2023-05-22 ENCOUNTER — Telehealth: Payer: Self-pay

## 2023-05-22 NOTE — Telephone Encounter (Signed)
 Pt spouse called about the referral for iron appts. It looks like the referral is in the system. The mobile number listed under Jasmine December (spouse) is the correct number to call.   Thank you, Morrie Sheldon

## 2023-05-26 ENCOUNTER — Encounter: Payer: Self-pay | Admitting: Oncology

## 2023-05-26 ENCOUNTER — Inpatient Hospital Stay

## 2023-05-26 ENCOUNTER — Inpatient Hospital Stay: Attending: Oncology | Admitting: Oncology

## 2023-05-26 VITALS — BP 133/87 | HR 60 | Temp 97.6°F | Resp 18 | Wt 247.7 lb

## 2023-05-26 DIAGNOSIS — D509 Iron deficiency anemia, unspecified: Secondary | ICD-10-CM | POA: Insufficient documentation

## 2023-05-26 NOTE — Assessment & Plan Note (Addendum)
 Labs are reviewed and discussed with patient. I discussed option of IV Venofer treatments. I discussed about the potential risks including but not limited to allergic reactions/infusion reactions including anaphylactic reactions, diarrhea, phlebitis, high blood pressure, wheezing, SOB, skin rash, weight gain,dark urine, leg swelling, back pain, headache, nausea and fatigue, etc.  Patient agrees with the plan.  Recommend IV Venofer weekly x 4.

## 2023-05-26 NOTE — Progress Notes (Addendum)
 Hematology/Oncology Consult note [phone] [phone]        REFERRING PROVIDER: Sherol Dade, MD   CHIEF COMPLAINTS/REASON FOR VISIT:  Evaluation of iron deficiency anemia    ASSESSMENT & PLAN:   IDA (iron deficiency anemia) Labs are reviewed and discussed with patient. I discussed option of IV Venofer treatments. I discussed about the potential risks including but not limited to allergic reactions/infusion reactions including anaphylactic reactions, diarrhea, phlebitis, high blood pressure, wheezing, SOB, skin rash, weight gain,dark urine, leg swelling, back pain, headache, nausea and fatigue, etc.  Patient agrees with the plan.  Recommend IV Venofer weekly x 4.   Orders Placed This Encounter  Procedures   Ferritin    Standing Status:   Future    Expected Date:   08/25/2023    Expiration Date:   05/25/2024   CBC with Differential (Cancer Center Only)    Standing Status:   Future    Expected Date:   08/25/2023    Expiration Date:   05/25/2024   Iron and TIBC    Standing Status:   Future    Expected Date:   08/25/2023    Expiration Date:   05/25/2024   Retic Panel    Standing Status:   Future    Expected Date:   08/25/2023    Expiration Date:   05/25/2024   Weekly Venofer x 4.  Follow-up in 3 months. All questions were answered. The patient knows to call the clinic with any problems, questions or concerns.  Rickard Patience, MD, [name] [facility] Hematology Oncology 05/26/2023   HISTORY OF PRESENTING ILLNESS:   [name] is a  73 y.o.  male with PMH listed below was seen in consultation at the request of  Sherol Dade, MD  for evaluation of iron deficiency anemia.  He reports a history of iron deficiency and has previously received iron infusions a few years ago at [facility] though he does not recall the specifics. No issues followed past infusions. Recent blood work on 05/14/2023 confirmed iron deficiency with hemoglobin 10.1, MCV 74.7, ferritin 3.5.   Iron saturation 5%.   Patient denies any rectal bleeding, black stool.  Not on anticoagulation or antiplatelet medication. He has history of celiac disease as well as history of gastric bypass  Last colonoscopy was about 6 years ago.  He experiences fatigue and dyspnea with minimal exertion, describing it as 'gasping for breath' and having 'no stamina.' No pica, lightheadedness, or chest pain.  He has a history of a partial toe amputation performed without anesthesia due to concerns about tolerating anesthesia.  Patient has a history of prostate cancer.  MEDICAL HISTORY:  Past Medical History:  Diagnosis Date   Anemia, unspecified    BPH (benign prostatic hyperplasia)    Bradycardia    Celiac disease    Chronic sinusitis    DVT (deep venous thrombosis) (HCC)    after gastric bypass surgery   Enlarged prostate    GERD (gastroesophageal reflux disease)    Glaucoma    Gout    Hernia of abdominal cavity    Hypercholesteremia    Hyperlipemia    Hypertension    Lactose intolerance    Major depressive disorder    Malignant neoplasm of prostate (HCC)    Neuropathy    OSA (obstructive sleep apnea)    Osteoarthritis of right knee    Peptic ulcer    Polyneuropathy    Postgastric surgery syndromes    Tachycardia  SURGICAL HISTORY: Past Surgical History:  Procedure Laterality Date   accesory pathway ablation     GASTRIC BYPASS  2008   due to ulcer   HERNIA REPAIR     ventral   INGUINAL HERNIA REPAIR Left    MASTECTOMY Right    SHOULDER SURGERY Right    replacement   SMALL BOWEL REPAIR  2012   for ulcer     SOCIAL HISTORY: Social History   Socioeconomic History   Marital status: Married    Spouse name: Not on file   Number of children: 1   Years of education: Masters   Highest education level: Not on file  Occupational History   Occupation: retired  Tobacco Use   Smoking status: Former    Current packs/day: 0.00    Types: Cigarettes    Quit date: 02/25/1980     Years since quitting: 43.2   Smokeless tobacco: Never  Substance and Sexual Activity   Alcohol use: No    Alcohol/week: 0.0 standard drinks of alcohol   Drug use: No   Sexual activity: Yes  Other Topics Concern   Not on file  Social History Narrative   No living will   Wife, then daughter should be health care POA   Would accept resuscitation   Not sure about tube feeds   Social Drivers of Health   Financial Resource Strain: Low Risk  (07/23/2022)   Received from [facility]   Overall Financial Resource Strain (CARDIA)    Difficulty of Paying Living Expenses: Not hard at all  Food Insecurity: No Food Insecurity (05/26/2023)   Hunger Vital Sign    Worried About Running Out of Food in the Last Year: Never true    Ran Out of Food in the Last Year: Never true  Transportation Needs: No Transportation Needs (05/14/2023)   Received from [facility]   PRAPARE - Transportation    Lack of Transportation (Medical): No    Lack of Transportation (Non-Medical): No  Physical Activity: Insufficiently Active (07/23/2022)   Received from [facility]   Exercise Vital Sign    Days of Exercise per Week: 4 days    Minutes of Exercise per Session: 30 min  Stress: No Stress Concern Present (07/23/2022)   Received from [facility] - Occupational Stress Questionnaire    Feeling of Stress : Not at all  Social Connections: Socially Integrated (07/23/2022)   Received from [facility]   Social Connection and Isolation Panel [NHANES]    Frequency of Communication with Friends and Family: More than three times a week    Frequency of Social Gatherings with Friends and Family: More than three times a week    Attends Religious Services: More than 4 times per year    Active Member of [employer] or Organizations: Yes    Attends Engineer, structural: More than 4 times per year    Marital Status: Married  Catering manager Violence: Not At Risk  (05/26/2023)   Humiliation, Afraid, Rape, and Kick questionnaire    Fear of Current or Ex-Partner: No    Emotionally Abused: No    Physically Abused: No    Sexually Abused: No    FAMILY HISTORY: Family History  Problem Relation Age of Onset   Alzheimer's disease Mother    Dementia Mother    Pulmonary disease Mother        aspiration pneumonia   Heart disease Father  Arrhythmia Father        pacemaker   Prostate cancer Father    Healthy Sister    Healthy Brother    Throat cancer Brother    Healthy Daughter     ALLERGIES:  is allergic to gabapentin, gluten meal, and lactose.  MEDICATIONS:  Current Outpatient Medications  Medication Sig Dispense Refill   allopurinol (ZYLOPRIM) 100 MG tablet Take 100 mg by mouth 2 (two) times daily.      amitriptyline (ELAVIL) 25 MG tablet Take 25-50 mg by mouth at bedtime.      azelastine (ASTELIN) 0.1 % nasal spray Place 2 sprays into both nostrils 2 (two) times daily.      B Complex Vitamins (VITAMIN B COMPLEX) TABS Take 1 tablet by mouth daily.     buPROPion (WELLBUTRIN XL) 150 MG 24 hr tablet Take 150 mg by mouth 2 (two) times daily.     celecoxib (CELEBREX) 100 MG capsule Take 100 mg by mouth daily.     Cholecalciferol 25 MCG (1000 UT) capsule Take by mouth.     cyanocobalamin 1000 MCG tablet Take 1 tablet by mouth daily.     cyclobenzaprine (FLEXERIL) 10 MG tablet Take by mouth.     DULoxetine (CYMBALTA) 60 MG capsule Take 60 mg by mouth daily.     famotidine (PEPCID) 20 MG tablet Take 20 mg by mouth 2 (two) times daily.     furosemide (LASIX) 20 MG tablet Take 20 mg by mouth daily.     loratadine (CLARITIN) 10 MG tablet Take 10 mg by mouth daily.     losartan (COZAAR) 50 MG tablet Take 50 mg by mouth daily.      memantine (NAMENDA) 10 MG tablet Take by mouth.     ondansetron (ZOFRAN-ODT) 4 MG disintegrating tablet Take 4 mg by mouth every 8 (eight) hours as needed.     pantoprazole (PROTONIX) 40 MG tablet Take 40 mg by mouth daily.      simethicone (MYLICON) 80 MG chewable tablet Chew 2 tablets (160 mg total) by mouth 4 (four) times daily as needed for flatulence. 30 tablet 0   simvastatin (ZOCOR) 40 MG tablet Take 40 mg by mouth daily.     thiamine 100 MG tablet Take 1 tablet by mouth daily.     timolol (TIMOPTIC) 0.5 % ophthalmic solution Place 1 drop into the left eye 2 (two) times daily.      No current facility-administered medications for this visit.    Review of Systems  Constitutional:  Positive for fatigue. Negative for appetite change, chills, fever and unexpected weight change.  HENT:   Negative for hearing loss and voice change.   Eyes:  Negative for eye problems and icterus.  Respiratory:  Positive for shortness of breath. Negative for chest tightness and cough.   Cardiovascular:  Positive for leg swelling. Negative for chest pain.  Gastrointestinal:  Negative for abdominal distention and abdominal pain.  Endocrine: Negative for hot flashes.  Genitourinary:  Negative for difficulty urinating, dysuria and frequency.   Musculoskeletal:  Negative for arthralgias.  Skin:  Negative for itching and rash.  Neurological:  Negative for light-headedness and numbness.  Hematological:  Negative for adenopathy. Does not bruise/bleed easily.  Psychiatric/Behavioral:  Negative for confusion.    PHYSICAL EXAMINATION: ECOG PERFORMANCE STATUS: 1 - Symptomatic but completely ambulatory Vitals:   05/26/23 1548  BP: 133/87  Pulse: 60  Resp: 18  Temp: 97.6 F (36.4 C)   Filed Weights   05/26/23  1548  Weight: 247 lb 11.2 oz (112.4 kg)    Physical Exam Constitutional:      General: He is not in acute distress. HENT:     Head: Normocephalic and atraumatic.  Eyes:     General: No scleral icterus. Cardiovascular:     Rate and Rhythm: Normal rate and regular rhythm.     Heart sounds: Normal heart sounds.  Pulmonary:     Effort: Pulmonary effort is normal. No respiratory distress.     Breath sounds: No wheezing.   Abdominal:     General: Bowel sounds are normal. There is no distension.     Palpations: Abdomen is soft.  Musculoskeletal:        General: Deformity present. Normal range of motion.     Cervical back: Normal range of motion and neck supple.     Comments: Bilateral lower extremity erythema +1  Skin:    General: Skin is warm and dry.     Findings: No erythema or rash.  Neurological:     Mental Status: He is alert and oriented to person, place, and time. Mental status is at baseline.  Psychiatric:        Mood and Affect: Mood normal.     LABORATORY DATA:  I have reviewed the data as listed    Latest Ref Rng & Units 06/13/2017    5:16 AM 06/10/2017    4:58 AM 06/09/2017    4:44 PM  CBC  WBC 4.0 - 10.5 K/uL 6.0  8.7    Hemoglobin 13.0 - 17.0 g/dL 29.5  62.1  30.8   Hematocrit 39.0 - 52.0 % 37.5  37.1  46.0   Platelets 150 - 400 K/uL 166  169        Latest Ref Rng & Units 06/12/2017    6:59 AM 06/10/2017    4:58 AM 06/09/2017    4:44 PM  CMP  Glucose 65 - 99 mg/dL 657  846  962   BUN 6 - 20 mg/dL 14  14  13    Creatinine 0.61 - 1.24 mg/dL 9.52  8.41  3.24   Sodium 135 - 145 mmol/L 142  135  135   Potassium 3.5 - 5.1 mmol/L 4.0  3.7  4.5   Chloride 101 - 111 mmol/L 110  103  97   CO2 22 - 32 mmol/L 23  22    Calcium 8.9 - 10.3 mg/dL 9.0  9.0        RADIOGRAPHIC STUDIES: I have personally reviewed the radiological images as listed and agreed with the findings in the report. No results found.

## 2023-06-02 ENCOUNTER — Inpatient Hospital Stay

## 2023-06-02 VITALS — BP 119/80 | HR 67 | Resp 16

## 2023-06-02 DIAGNOSIS — D509 Iron deficiency anemia, unspecified: Secondary | ICD-10-CM | POA: Diagnosis not present

## 2023-06-02 DIAGNOSIS — Z9884 Bariatric surgery status: Secondary | ICD-10-CM

## 2023-06-02 MED ORDER — IRON SUCROSE 20 MG/ML IV SOLN
200.0000 mg | Freq: Once | INTRAVENOUS | Status: AC
  Administered 2023-06-02: 200 mg via INTRAVENOUS
  Filled 2023-06-02: qty 10

## 2023-06-09 ENCOUNTER — Inpatient Hospital Stay

## 2023-06-09 VITALS — BP 135/86 | HR 85 | Temp 96.1°F | Resp 18

## 2023-06-09 DIAGNOSIS — D509 Iron deficiency anemia, unspecified: Secondary | ICD-10-CM | POA: Diagnosis not present

## 2023-06-09 DIAGNOSIS — Z9884 Bariatric surgery status: Secondary | ICD-10-CM

## 2023-06-09 MED ORDER — IRON SUCROSE 20 MG/ML IV SOLN
200.0000 mg | Freq: Once | INTRAVENOUS | Status: AC
  Administered 2023-06-09: 200 mg via INTRAVENOUS

## 2023-06-09 MED ORDER — SODIUM CHLORIDE 0.9% FLUSH
10.0000 mL | Freq: Once | INTRAVENOUS | Status: AC | PRN
  Administered 2023-06-09: 10 mL
  Filled 2023-06-09: qty 10

## 2023-06-15 ENCOUNTER — Inpatient Hospital Stay

## 2023-06-15 VITALS — BP 142/75 | HR 75 | Resp 16

## 2023-06-15 DIAGNOSIS — Z9884 Bariatric surgery status: Secondary | ICD-10-CM

## 2023-06-15 DIAGNOSIS — D509 Iron deficiency anemia, unspecified: Secondary | ICD-10-CM | POA: Diagnosis not present

## 2023-06-15 MED ORDER — IRON SUCROSE 20 MG/ML IV SOLN
200.0000 mg | Freq: Once | INTRAVENOUS | Status: AC
  Administered 2023-06-15: 200 mg via INTRAVENOUS
  Filled 2023-06-15: qty 10

## 2023-06-16 ENCOUNTER — Inpatient Hospital Stay

## 2023-06-18 ENCOUNTER — Inpatient Hospital Stay

## 2023-06-23 ENCOUNTER — Inpatient Hospital Stay

## 2023-06-29 ENCOUNTER — Inpatient Hospital Stay: Attending: Oncology

## 2023-06-29 VITALS — BP 143/80 | HR 69 | Temp 96.1°F | Resp 18

## 2023-06-29 DIAGNOSIS — Z9884 Bariatric surgery status: Secondary | ICD-10-CM

## 2023-06-29 DIAGNOSIS — D509 Iron deficiency anemia, unspecified: Secondary | ICD-10-CM | POA: Insufficient documentation

## 2023-06-29 MED ORDER — SODIUM CHLORIDE 0.9% FLUSH
10.0000 mL | Freq: Once | INTRAVENOUS | Status: AC | PRN
  Administered 2023-06-29: 10 mL
  Filled 2023-06-29: qty 10

## 2023-06-29 MED ORDER — IRON SUCROSE 20 MG/ML IV SOLN
200.0000 mg | Freq: Once | INTRAVENOUS | Status: AC
  Administered 2023-06-29: 200 mg via INTRAVENOUS

## 2023-08-25 ENCOUNTER — Inpatient Hospital Stay: Attending: Oncology

## 2023-08-25 DIAGNOSIS — D509 Iron deficiency anemia, unspecified: Secondary | ICD-10-CM | POA: Insufficient documentation

## 2023-08-25 LAB — CBC WITH DIFFERENTIAL (CANCER CENTER ONLY)
Abs Immature Granulocytes: 0.05 10*3/uL (ref 0.00–0.07)
Basophils Absolute: 0.1 10*3/uL (ref 0.0–0.1)
Basophils Relative: 1 %
Eosinophils Absolute: 0.1 10*3/uL (ref 0.0–0.5)
Eosinophils Relative: 2 %
HCT: 37.2 % — ABNORMAL LOW (ref 39.0–52.0)
Hemoglobin: 11.2 g/dL — ABNORMAL LOW (ref 13.0–17.0)
Immature Granulocytes: 1 %
Lymphocytes Relative: 17 %
Lymphs Abs: 1 10*3/uL (ref 0.7–4.0)
MCH: 25.6 pg — ABNORMAL LOW (ref 26.0–34.0)
MCHC: 30.1 g/dL (ref 30.0–36.0)
MCV: 84.9 fL (ref 80.0–100.0)
Monocytes Absolute: 0.3 10*3/uL (ref 0.1–1.0)
Monocytes Relative: 6 %
Neutro Abs: 4 10*3/uL (ref 1.7–7.7)
Neutrophils Relative %: 73 %
Platelet Count: 189 10*3/uL (ref 150–400)
RBC: 4.38 MIL/uL (ref 4.22–5.81)
RDW: 16.6 % — ABNORMAL HIGH (ref 11.5–15.5)
WBC Count: 5.5 10*3/uL (ref 4.0–10.5)
nRBC: 0 % (ref 0.0–0.2)

## 2023-08-25 LAB — RETIC PANEL
Immature Retic Fract: 23 % — ABNORMAL HIGH (ref 2.3–15.9)
RBC.: 4.35 MIL/uL (ref 4.22–5.81)
Retic Count, Absolute: 81.3 10*3/uL (ref 19.0–186.0)
Retic Ct Pct: 1.9 % (ref 0.4–3.1)
Reticulocyte Hemoglobin: 26.7 pg — ABNORMAL LOW (ref >27.9)

## 2023-08-25 LAB — IRON AND TIBC
Iron: 31 ug/dL — ABNORMAL LOW (ref 45–182)
Saturation Ratios: 7 % — ABNORMAL LOW (ref 17.9–39.5)
TIBC: 447 ug/dL (ref 250–450)
UIBC: 416 ug/dL

## 2023-08-25 LAB — FERRITIN: Ferritin: 4 ng/mL — ABNORMAL LOW (ref 24–336)

## 2023-09-01 ENCOUNTER — Encounter: Payer: Self-pay | Admitting: Oncology

## 2023-09-01 ENCOUNTER — Inpatient Hospital Stay

## 2023-09-01 ENCOUNTER — Inpatient Hospital Stay (HOSPITAL_BASED_OUTPATIENT_CLINIC_OR_DEPARTMENT_OTHER): Admitting: Oncology

## 2023-09-01 VITALS — BP 121/62 | HR 46 | Temp 98.5°F | Resp 16 | Wt 248.6 lb

## 2023-09-01 VITALS — BP 113/64 | HR 45 | Temp 96.8°F | Resp 17

## 2023-09-01 DIAGNOSIS — Z9884 Bariatric surgery status: Secondary | ICD-10-CM

## 2023-09-01 DIAGNOSIS — D509 Iron deficiency anemia, unspecified: Secondary | ICD-10-CM | POA: Diagnosis not present

## 2023-09-01 MED ORDER — SODIUM CHLORIDE 0.9% FLUSH
10.0000 mL | Freq: Once | INTRAVENOUS | Status: AC | PRN
  Administered 2023-09-01: 10 mL
  Filled 2023-09-01: qty 10

## 2023-09-01 MED ORDER — IRON SUCROSE 20 MG/ML IV SOLN
200.0000 mg | Freq: Once | INTRAVENOUS | Status: AC
  Administered 2023-09-01: 200 mg via INTRAVENOUS

## 2023-09-01 NOTE — Assessment & Plan Note (Addendum)
 Labs are reviewed and discussed with patient. Lab Results  Component Value Date   HGB 11.2 (L) 08/25/2023   TIBC 447 08/25/2023   IRONPCTSAT 7 (L) 08/25/2023   FERRITIN 4 (L) 08/25/2023    Some improvement of hemoglobin.  Still persistent iron  deficient.  Recommend IV Venofer  weekly x 4. Etiology of iron  deficiency, probably secondary to celiac disease/gastric bypass. He also has a history of melanoma/primary blood per rectum.  Suspect GI bleeding. He has seen [facility] gastroenterology and was offered for colonoscopy.  He patient declined due to the concern of anesthesia related side effects.

## 2023-09-01 NOTE — Progress Notes (Signed)
 Hematology/Oncology Progress note [phone] [phone]           REFERRING PROVIDER: Waylan Almarie SAUNDERS, MD   CHIEF COMPLAINTS/REASON FOR VISIT:  Evaluation of iron  deficiency anemia    ASSESSMENT & PLAN:   IDA (iron  deficiency anemia) Labs are reviewed and discussed with patient. Lab Results  Component Value Date   HGB 11.2 (L) 08/25/2023   TIBC 447 08/25/2023   IRONPCTSAT 7 (L) 08/25/2023   FERRITIN 4 (L) 08/25/2023    Some improvement of hemoglobin.  Still persistent iron  deficient.  Recommend IV Venofer  weekly x 4. Etiology of iron  deficiency, probably secondary to celiac disease/gastric bypass. He also has a history of melanoma/primary blood per rectum.  Suspect GI bleeding. He has seen [facility] gastroenterology and was offered for colonoscopy.  He patient declined due to the concern of anesthesia related side effects.   Orders Placed This Encounter  Procedures   CBC with Differential (Cancer Center Only)    Standing Status:   Future    Expected Date:   11/02/2023    Expiration Date:   01/31/2024   Iron  and TIBC    Standing Status:   Future    Expected Date:   11/02/2023    Expiration Date:   01/31/2024   Ferritin    Standing Status:   Future    Expected Date:   11/02/2023    Expiration Date:   01/31/2024   Retic Panel    Standing Status:   Future    Expected Date:   11/02/2023    Expiration Date:   01/31/2024   Weekly Venofer  x 4.  Follow-up in 3 months. We spent sufficient time to discuss many aspect of care, questions were answered to patient's satisfaction. A total of 25 minutes was spent on this visit.  With 5 minutes spent reviewing lab results, 15 minutes counseling the patient on the diagnosis, plan of iron  infusion and recommendation of GI workup.  Additional 5 minutes was spent on answering patient's questions.  All questions were answered. The patient knows to call the clinic with any problems, questions or concerns.  Zelphia Cap, MD,  [name] [facility] Hematology Oncology 09/01/2023   HISTORY OF PRESENTING ILLNESS:   [name] is a  73 y.o.  male with PMH listed below was seen in consultation at the request of  Waylan Almarie SAUNDERS, MD  for evaluation of iron  deficiency anemia.  He reports a history of iron  deficiency and has previously received iron  infusions a few years ago at [facility] though he does not recall the specifics. No issues followed past infusions. Recent blood work on 05/14/2023 confirmed iron  deficiency with hemoglobin 10.1, MCV 74.7, ferritin 3.5.  Iron  saturation 5%.   Patient denies any rectal bleeding, black stool.  Not on anticoagulation or antiplatelet medication. He has history of celiac disease as well as history of gastric bypass  Last colonoscopy was about 6 years ago.  He experiences fatigue and dyspnea with minimal exertion, describing it as 'gasping for breath' and having 'no stamina.' No pica, lightheadedness, or chest pain.  He has a history of a partial toe amputation performed without anesthesia due to concerns about tolerating anesthesia.  Patient has a history of prostate cancer.  MEDICAL HISTORY:  Past Medical History:  Diagnosis Date   Anemia, unspecified    BPH (benign prostatic hyperplasia)    Bradycardia    Celiac disease    Chronic sinusitis    DVT (deep venous thrombosis) (HCC)  after gastric bypass surgery   Enlarged prostate    GERD (gastroesophageal reflux disease)    Glaucoma    Gout    Hernia of abdominal cavity    Hypercholesteremia    Hyperlipemia    Hypertension    Lactose intolerance    Major depressive disorder    Malignant neoplasm of prostate (HCC)    Neuropathy    OSA (obstructive sleep apnea)    Osteoarthritis of right knee    Peptic ulcer    Polyneuropathy    Postgastric surgery syndromes    Tachycardia     SURGICAL HISTORY: Past Surgical History:  Procedure Laterality Date   accesory pathway ablation     GASTRIC BYPASS  2008   due to  ulcer   HERNIA REPAIR     ventral   INGUINAL HERNIA REPAIR Left    MASTECTOMY Right    SHOULDER SURGERY Right    replacement   SMALL BOWEL REPAIR  2012   for ulcer     SOCIAL HISTORY: Social History   Socioeconomic History   Marital status: Married    Spouse name: Not on file   Number of children: 1   Years of education: Masters   Highest education level: Not on file  Occupational History   Occupation: retired  Tobacco Use   Smoking status: Former    Current packs/day: 0.00    Types: Cigarettes    Quit date: 02/25/1980    Years since quitting: 43.5   Smokeless tobacco: Never  Substance and Sexual Activity   Alcohol use: No    Alcohol/week: 0.0 standard drinks of alcohol   Drug use: No   Sexual activity: Yes  Other Topics Concern   Not on file  Social History Narrative   No living will   Wife, then daughter should be health care POA   Would accept resuscitation   Not sure about tube feeds   Social Drivers of Health   Financial Resource Strain: Low Risk  (07/29/2023)   Received from [facility]   Overall Financial Resource Strain (CARDIA)    Difficulty of Paying Living Expenses: Not hard at all  Food Insecurity: No Food Insecurity (07/29/2023)   Received from [facility]   Hunger Vital Sign    Within the past 12 months, you worried that your food would run out before you got the money to buy more.: Never true    Within the past 12 months, the food you bought just didn't last and you didn't have money to get more.: Never true  Transportation Needs: No Transportation Needs (07/29/2023)   Received from [facility] - Transportation    Lack of Transportation (Medical): No    Lack of Transportation (Non-Medical): No  Physical Activity: Inactive (07/29/2023)   Received from [facility]   Exercise Vital Sign    On average, how many days per week do you engage in moderate to strenuous exercise (like a brisk walk)?: 0 days    On average, how many  minutes do you engage in exercise at this level?: 0 min  Stress: No Stress Concern Present (07/29/2023)   Received from [facility] - Occupational Stress Questionnaire    Feeling of Stress : Not at all  Social Connections: Socially Integrated (07/29/2023)   Received from [facility]   Social Connection and Isolation Panel    In a typical week, how many times  do you talk on the phone with family, friends, or neighbors?: More than three times a week    How often do you get together with friends or relatives?: More than three times a week    How often do you attend church or religious services?: More than 4 times per year    Do you belong to any clubs or organizations such as church groups, unions, fraternal or athletic groups, or school groups?: Yes    How often do you attend meetings of the clubs or organizations you belong to?: More than 4 times per year    Are you married, widowed, divorced, separated, never married, or living with a partner?: Married  Intimate Partner Violence: Not At Risk (07/29/2023)   Received from [facility]   Humiliation, Afraid, Rape, and Kick questionnaire    Within the last year, have you been afraid of your partner or ex-partner?: No    Within the last year, have you been humiliated or emotionally abused in other ways by your partner or ex-partner?: No    Within the last year, have you been kicked, hit, slapped, or otherwise physically hurt by your partner or ex-partner?: No    Within the last year, have you been raped or forced to have any kind of sexual activity by your partner or ex-partner?: No    FAMILY HISTORY: Family History  Problem Relation Age of Onset   Alzheimer's disease Mother    Dementia Mother    Pulmonary disease Mother        aspiration pneumonia   Heart disease Father    Arrhythmia Father        pacemaker   Prostate cancer Father    Healthy Sister    Healthy Brother    Throat cancer  Brother    Healthy Daughter     ALLERGIES:  is allergic to gabapentin , gluten meal, and lactose.  MEDICATIONS:  Current Outpatient Medications  Medication Sig Dispense Refill   allopurinol  (ZYLOPRIM ) 100 MG tablet Take 100 mg by mouth 2 (two) times daily.      amitriptyline  (ELAVIL ) 25 MG tablet Take 25-50 mg by mouth at bedtime.      azelastine  (ASTELIN ) 0.1 % nasal spray Place 2 sprays into both nostrils 2 (two) times daily.      B Complex Vitamins (VITAMIN B COMPLEX) TABS Take 1 tablet by mouth daily.     buPROPion (WELLBUTRIN XL) 150 MG 24 hr tablet Take 150 mg by mouth 2 (two) times daily.     celecoxib (CELEBREX) 100 MG capsule Take 100 mg by mouth daily.     Cholecalciferol 25 MCG (1000 UT) capsule Take by mouth.     cyanocobalamin  1000 MCG tablet Take 1 tablet by mouth daily.     cyclobenzaprine (FLEXERIL) 10 MG tablet Take by mouth.     DULoxetine  (CYMBALTA ) 60 MG capsule Take 60 mg by mouth daily.     famotidine  (PEPCID ) 20 MG tablet Take 20 mg by mouth 2 (two) times daily.     furosemide (LASIX) 20 MG tablet Take 20 mg by mouth daily.     loratadine  (CLARITIN ) 10 MG tablet Take 10 mg by mouth daily.     losartan  (COZAAR ) 50 MG tablet Take 50 mg by mouth daily.      memantine (NAMENDA) 10 MG tablet Take by mouth.     ondansetron  (ZOFRAN -ODT) 4 MG disintegrating tablet Take 4 mg by mouth every 8 (eight) hours as needed.  pantoprazole  (PROTONIX ) 40 MG tablet Take 40 mg by mouth daily.     simethicone  (MYLICON) 80 MG chewable tablet Chew 2 tablets (160 mg total) by mouth 4 (four) times daily as needed for flatulence. 30 tablet 0   simvastatin  (ZOCOR ) 40 MG tablet Take 40 mg by mouth daily.     thiamine  100 MG tablet Take 1 tablet by mouth daily.     timolol  (TIMOPTIC ) 0.5 % ophthalmic solution Place 1 drop into the left eye 2 (two) times daily.      No current facility-administered medications for this visit.    Review of Systems  Constitutional:  Positive for fatigue.  Negative for appetite change, chills, fever and unexpected weight change.  HENT:   Negative for hearing loss and voice change.   Eyes:  Negative for eye problems and icterus.  Respiratory:  Positive for shortness of breath. Negative for chest tightness and cough.   Cardiovascular:  Positive for leg swelling. Negative for chest pain.  Gastrointestinal:  Negative for abdominal distention and abdominal pain.  Endocrine: Negative for hot flashes.  Genitourinary:  Negative for difficulty urinating, dysuria and frequency.   Musculoskeletal:  Negative for arthralgias.  Skin:  Negative for itching and rash.  Neurological:  Negative for light-headedness and numbness.  Hematological:  Negative for adenopathy. Does not bruise/bleed easily.  Psychiatric/Behavioral:  Negative for confusion.    PHYSICAL EXAMINATION: ECOG PERFORMANCE STATUS: 1 - Symptomatic but completely ambulatory Vitals:   09/01/23 0959  BP: 121/62  Pulse: (!) 46  Resp: 16  Temp: 98.5 F (36.9 C)  SpO2: 100%   Filed Weights   09/01/23 0959  Weight: 248 lb 9.6 oz (112.8 kg)    Physical Exam Constitutional:      General: He is not in acute distress. HENT:     Head: Normocephalic and atraumatic.  Eyes:     General: No scleral icterus. Cardiovascular:     Rate and Rhythm: Normal rate and regular rhythm.     Heart sounds: Normal heart sounds.  Pulmonary:     Effort: Pulmonary effort is normal. No respiratory distress.     Breath sounds: No wheezing.  Abdominal:     General: Bowel sounds are normal. There is no distension.     Palpations: Abdomen is soft.  Musculoskeletal:        General: Deformity present. Normal range of motion.     Cervical back: Normal range of motion and neck supple.     Comments: Bilateral lower extremity erythema +1  Skin:    General: Skin is warm and dry.     Findings: No erythema or rash.  Neurological:     Mental Status: He is alert and oriented to person, place, and time. Mental status  is at baseline.  Psychiatric:        Mood and Affect: Mood normal.     LABORATORY DATA:  I have reviewed the data as listed    Latest Ref Rng & Units 08/25/2023    9:43 AM 06/13/2017    5:16 AM 06/10/2017    4:58 AM  CBC  WBC 4.0 - 10.5 K/uL 5.5  6.0  8.7   Hemoglobin 13.0 - 17.0 g/dL 88.7  87.1  87.0   Hematocrit 39.0 - 52.0 % 37.2  37.5  37.1   Platelets 150 - 400 K/uL 189  166  169       Latest Ref Rng & Units 06/12/2017    6:59 AM 06/10/2017  4:58 AM 06/09/2017    4:44 PM  CMP  Glucose 65 - 99 mg/dL 897  878  822   BUN 6 - 20 mg/dL 14  14  13    Creatinine 0.61 - 1.24 mg/dL 8.99  9.05  9.19   Sodium 135 - 145 mmol/L 142  135  135   Potassium 3.5 - 5.1 mmol/L 4.0  3.7  4.5   Chloride 101 - 111 mmol/L 110  103  97   CO2 22 - 32 mmol/L 23  22    Calcium 8.9 - 10.3 mg/dL 9.0  9.0        RADIOGRAPHIC STUDIES: I have personally reviewed the radiological images as listed and agreed with the findings in the report. No results found.

## 2023-09-07 ENCOUNTER — Inpatient Hospital Stay

## 2023-09-07 VITALS — BP 128/78 | HR 55 | Temp 97.0°F | Resp 18

## 2023-09-07 DIAGNOSIS — Z9884 Bariatric surgery status: Secondary | ICD-10-CM

## 2023-09-07 DIAGNOSIS — D509 Iron deficiency anemia, unspecified: Secondary | ICD-10-CM

## 2023-09-07 MED ORDER — IRON SUCROSE 20 MG/ML IV SOLN
200.0000 mg | Freq: Once | INTRAVENOUS | Status: AC
  Administered 2023-09-07: 200 mg via INTRAVENOUS
  Filled 2023-09-07: qty 10

## 2023-09-07 NOTE — Patient Instructions (Signed)

## 2023-09-09 ENCOUNTER — Ambulatory Visit

## 2023-09-15 ENCOUNTER — Ambulatory Visit

## 2023-09-22 ENCOUNTER — Inpatient Hospital Stay

## 2023-09-22 VITALS — BP 129/87 | HR 60 | Temp 96.9°F | Resp 18

## 2023-09-22 DIAGNOSIS — D509 Iron deficiency anemia, unspecified: Secondary | ICD-10-CM | POA: Diagnosis not present

## 2023-09-22 DIAGNOSIS — Z9884 Bariatric surgery status: Secondary | ICD-10-CM

## 2023-09-22 MED ORDER — IRON SUCROSE 20 MG/ML IV SOLN
200.0000 mg | Freq: Once | INTRAVENOUS | Status: AC
  Administered 2023-09-22: 200 mg via INTRAVENOUS
  Filled 2023-09-22: qty 10

## 2023-09-22 NOTE — Patient Instructions (Signed)

## 2023-09-23 ENCOUNTER — Telehealth: Payer: Self-pay | Admitting: *Deleted

## 2023-09-23 NOTE — Telephone Encounter (Signed)
 Patient wife called and stated patient missed his 09/15/23 iron  infusion appointment.  Requesting to schedule one for next week.  Per Dr Yu's laston 09/01/23 note, iron  infusion x 4.  Patient received on 09/01/23, 09/07/23, 09/22/23.  Pt has labs scheduled on 11/02/23.

## 2023-09-23 NOTE — Telephone Encounter (Signed)
 Thank you :)

## 2023-09-29 ENCOUNTER — Inpatient Hospital Stay

## 2023-10-05 ENCOUNTER — Inpatient Hospital Stay: Attending: Oncology

## 2023-10-05 VITALS — BP 137/61 | HR 61 | Temp 97.2°F | Resp 18

## 2023-10-05 DIAGNOSIS — D509 Iron deficiency anemia, unspecified: Secondary | ICD-10-CM | POA: Diagnosis present

## 2023-10-05 DIAGNOSIS — Z9884 Bariatric surgery status: Secondary | ICD-10-CM

## 2023-10-05 MED ORDER — IRON SUCROSE 20 MG/ML IV SOLN
200.0000 mg | Freq: Once | INTRAVENOUS | Status: AC
  Administered 2023-10-05 (×2): 200 mg via INTRAVENOUS
  Filled 2023-10-05: qty 10

## 2023-10-05 NOTE — Patient Instructions (Signed)

## 2023-11-02 ENCOUNTER — Inpatient Hospital Stay: Attending: Oncology

## 2023-11-02 DIAGNOSIS — K900 Celiac disease: Secondary | ICD-10-CM | POA: Insufficient documentation

## 2023-11-02 DIAGNOSIS — Z8546 Personal history of malignant neoplasm of prostate: Secondary | ICD-10-CM | POA: Diagnosis not present

## 2023-11-02 DIAGNOSIS — Z808 Family history of malignant neoplasm of other organs or systems: Secondary | ICD-10-CM | POA: Insufficient documentation

## 2023-11-02 DIAGNOSIS — Z8042 Family history of malignant neoplasm of prostate: Secondary | ICD-10-CM | POA: Insufficient documentation

## 2023-11-02 DIAGNOSIS — Z87891 Personal history of nicotine dependence: Secondary | ICD-10-CM | POA: Diagnosis not present

## 2023-11-02 DIAGNOSIS — D509 Iron deficiency anemia, unspecified: Secondary | ICD-10-CM | POA: Diagnosis present

## 2023-11-02 DIAGNOSIS — Z9884 Bariatric surgery status: Secondary | ICD-10-CM | POA: Diagnosis not present

## 2023-11-02 LAB — CBC WITH DIFFERENTIAL (CANCER CENTER ONLY)
Abs Immature Granulocytes: 0.07 K/uL (ref 0.00–0.07)
Basophils Absolute: 0.1 K/uL (ref 0.0–0.1)
Basophils Relative: 1 %
Eosinophils Absolute: 0.1 K/uL (ref 0.0–0.5)
Eosinophils Relative: 1 %
HCT: 39.7 % (ref 39.0–52.0)
Hemoglobin: 12.5 g/dL — ABNORMAL LOW (ref 13.0–17.0)
Immature Granulocytes: 1 %
Lymphocytes Relative: 12 %
Lymphs Abs: 0.7 K/uL (ref 0.7–4.0)
MCH: 28.4 pg (ref 26.0–34.0)
MCHC: 31.5 g/dL (ref 30.0–36.0)
MCV: 90.2 fL (ref 80.0–100.0)
Monocytes Absolute: 0.4 K/uL (ref 0.1–1.0)
Monocytes Relative: 6 %
Neutro Abs: 4.9 K/uL (ref 1.7–7.7)
Neutrophils Relative %: 79 %
Platelet Count: 173 K/uL (ref 150–400)
RBC: 4.4 MIL/uL (ref 4.22–5.81)
RDW: 16.8 % — ABNORMAL HIGH (ref 11.5–15.5)
WBC Count: 6.2 K/uL (ref 4.0–10.5)
nRBC: 0 % (ref 0.0–0.2)

## 2023-11-02 LAB — RETIC PANEL
Immature Retic Fract: 20.9 % — ABNORMAL HIGH (ref 2.3–15.9)
RBC.: 4.3 MIL/uL (ref 4.22–5.81)
Retic Count, Absolute: 105.4 K/uL (ref 19.0–186.0)
Retic Ct Pct: 2.5 % (ref 0.4–3.1)
Reticulocyte Hemoglobin: 31.1 pg (ref >27.9)

## 2023-11-02 LAB — IRON AND TIBC
Iron: 39 ug/dL — ABNORMAL LOW (ref 45–182)
Saturation Ratios: 11 % — ABNORMAL LOW (ref 17.9–39.5)
TIBC: 346 ug/dL (ref 250–450)
UIBC: 307 ug/dL

## 2023-11-02 LAB — FERRITIN: Ferritin: 19 ng/mL — ABNORMAL LOW (ref 24–336)

## 2023-11-09 ENCOUNTER — Encounter: Payer: Self-pay | Admitting: Oncology

## 2023-11-09 ENCOUNTER — Inpatient Hospital Stay

## 2023-11-09 ENCOUNTER — Inpatient Hospital Stay (HOSPITAL_BASED_OUTPATIENT_CLINIC_OR_DEPARTMENT_OTHER): Admitting: Oncology

## 2023-11-09 VITALS — BP 119/70 | HR 90

## 2023-11-09 VITALS — BP 110/66 | HR 54 | Temp 98.6°F | Resp 17 | Wt 242.2 lb

## 2023-11-09 DIAGNOSIS — D509 Iron deficiency anemia, unspecified: Secondary | ICD-10-CM

## 2023-11-09 DIAGNOSIS — Z9884 Bariatric surgery status: Secondary | ICD-10-CM

## 2023-11-09 MED ORDER — SODIUM CHLORIDE 0.9% FLUSH
10.0000 mL | Freq: Once | INTRAVENOUS | Status: AC | PRN
  Administered 2023-11-09: 10 mL
  Filled 2023-11-09: qty 10

## 2023-11-09 MED ORDER — IRON SUCROSE 20 MG/ML IV SOLN
200.0000 mg | Freq: Once | INTRAVENOUS | Status: AC
  Administered 2023-11-09: 200 mg via INTRAVENOUS
  Filled 2023-11-09: qty 10

## 2023-11-09 NOTE — Progress Notes (Signed)
 Hematology/Oncology Progress note [phone] [phone]           REFERRING PROVIDER: Babara Call, MD   CHIEF COMPLAINTS/REASON FOR VISIT:  Evaluation of iron  deficiency anemia    ASSESSMENT & PLAN:   IDA (iron  deficiency anemia) Labs are reviewed and discussed with patient. Lab Results  Component Value Date   HGB 12.5 (L) 11/02/2023   TIBC 346 11/02/2023   IRONPCTSAT 11 (L) 11/02/2023   FERRITIN 19 (L) 11/02/2023    Some improvement of hemoglobin.  Still persistent iron  deficient.  Recommend IV Venofer  weekly x 2. Suspect GI blood loss.  He has discussed with gastroenterology.  Pending a discussion with anesthesiologist before making a decision for endoscopy.   Orders Placed This Encounter  Procedures   CBC (Cancer Center Only)    Standing Status:   Future    Expected Date:   03/10/2024    Expiration Date:   06/08/2024   Iron  and TIBC    Standing Status:   Future    Expected Date:   03/10/2024    Expiration Date:   06/08/2024   Ferritin    Standing Status:   Future    Expected Date:   03/10/2024    Expiration Date:   06/08/2024   Retic Panel    Standing Status:   Future    Expected Date:   03/10/2024    Expiration Date:   06/08/2024   Vitamin B12    Standing Status:   Future    Expected Date:   03/10/2024    Expiration Date:   06/08/2024   Vitamin B1    Standing Status:   Future    Expected Date:   03/10/2024    Expiration Date:   06/08/2024    Follow-up in 4 months. We spent sufficient time to discuss many aspect of care, questions were answered to patient's satisfaction.  Call Babara, MD, [name] [facility] Hematology Oncology 11/09/2023   HISTORY OF PRESENTING ILLNESS:   [name] is a  73 y.o.  male with PMH listed below was seen in consultation at the request of  Babara Call, MD  for evaluation of iron  deficiency anemia.  He reports a history of iron  deficiency and has previously received iron  infusions a few years ago at [facility] though he  does not recall the specifics. No issues followed past infusions. Recent blood work on 05/14/2023 confirmed iron  deficiency with hemoglobin 10.1, MCV 74.7, ferritin 3.5.  Iron  saturation 5%.   Patient denies any rectal bleeding, black stool.  Not on anticoagulation or antiplatelet medication. He has history of celiac disease as well as history of gastric bypass  Last colonoscopy was about 6 years ago.  He experiences fatigue and dyspnea with minimal exertion, describing it as 'gasping for breath' and having 'no stamina.' No pica, lightheadedness, or chest pain.  He has a history of a partial toe amputation performed without anesthesia due to concerns about tolerating anesthesia.  Patient has a history of prostate cancer.  INTERVAL HISTORY [name] is a 73 y.o. male who has above history reviewed by me today presents for follow up visit for iron  deficiency anemia.  Tolerated IV Venofer  treatments.  No new complaints.   MEDICAL HISTORY:  Past Medical History:  Diagnosis Date   Anemia, unspecified    BPH (benign prostatic hyperplasia)    Bradycardia    Celiac disease    Chronic sinusitis    DVT (deep venous thrombosis) (HCC)  after gastric bypass surgery   Enlarged prostate    GERD (gastroesophageal reflux disease)    Glaucoma    Gout    Hernia of abdominal cavity    Hypercholesteremia    Hyperlipemia    Hypertension    Lactose intolerance    Major depressive disorder    Malignant neoplasm of prostate (HCC)    Neuropathy    OSA (obstructive sleep apnea)    Osteoarthritis of right knee    Peptic ulcer    Polyneuropathy    Postgastric surgery syndromes    Tachycardia     SURGICAL HISTORY: Past Surgical History:  Procedure Laterality Date   accesory pathway ablation     GASTRIC BYPASS  2008   due to ulcer   HERNIA REPAIR     ventral   INGUINAL HERNIA REPAIR Left    MASTECTOMY Right    SHOULDER SURGERY Right    replacement   SMALL BOWEL REPAIR  2012   for  ulcer     SOCIAL HISTORY: Social History   Socioeconomic History   Marital status: Married    Spouse name: Not on file   Number of children: 1   Years of education: Masters   Highest education level: Not on file  Occupational History   Occupation: retired  Tobacco Use   Smoking status: Former    Current packs/day: 0.00    Types: Cigarettes    Quit date: 02/25/1980    Years since quitting: 43.7   Smokeless tobacco: Never  Substance and Sexual Activity   Alcohol use: No    Alcohol/week: 0.0 standard drinks of alcohol   Drug use: No   Sexual activity: Yes  Other Topics Concern   Not on file  Social History Narrative   No living will   Wife, then daughter should be health care POA   Would accept resuscitation   Not sure about tube feeds   Social Drivers of Health   Financial Resource Strain: Low Risk  (07/29/2023)   Received from [facility]   Overall Financial Resource Strain (CARDIA)    Difficulty of Paying Living Expenses: Not hard at all  Food Insecurity: No Food Insecurity (07/29/2023)   Received from [facility]   Hunger Vital Sign    Within the past 12 months, you worried that your food would run out before you got the money to buy more.: Never true    Within the past 12 months, the food you bought just didn't last and you didn't have money to get more.: Never true  Transportation Needs: No Transportation Needs (07/29/2023)   Received from [facility] - [facility]    Lack of Transportation (Medical): No    Lack of Transportation (Non-Medical): No  Physical Activity: Inactive (07/29/2023)   Received from [facility]   Exercise Vital Sign    On average, how many days per week do you engage in moderate to strenuous exercise (like a brisk walk)?: 0 days    On average, how many minutes do you engage in exercise at this level?: 0 min  Stress: No Stress Concern Present (07/29/2023)   Received from [facility] - Occupational Stress Questionnaire    Feeling of Stress : Not at all  Social Connections: Socially Integrated (07/29/2023)   Received from [facility]   Social Connection and Isolation Panel    In a typical week, how many times  do you talk on the phone with family, friends, or neighbors?: More than three times a week    How often do you get together with friends or relatives?: More than three times a week    How often do you attend church or religious services?: More than 4 times per year    Do you belong to any clubs or organizations such as church groups, unions, fraternal or athletic groups, or school groups?: Yes    How often do you attend meetings of the clubs or organizations you belong to?: More than 4 times per year    Are you married, widowed, divorced, separated, never married, or living with a partner?: Married  Intimate Partner Violence: Not At Risk (07/29/2023)   Received from [facility]   Humiliation, Afraid, Rape, and Kick questionnaire    Within the last year, have you been afraid of your partner or ex-partner?: No    Within the last year, have you been humiliated or emotionally abused in other ways by your partner or ex-partner?: No    Within the last year, have you been kicked, hit, slapped, or otherwise physically hurt by your partner or ex-partner?: No    Within the last year, have you been raped or forced to have any kind of sexual activity by your partner or ex-partner?: No    FAMILY HISTORY: Family History  Problem Relation Age of Onset   Alzheimer's disease Mother    Dementia Mother    Pulmonary disease Mother        aspiration pneumonia   Heart disease Father    Arrhythmia Father        pacemaker   Prostate cancer Father    Healthy Sister    Healthy Brother    Throat cancer Brother    Healthy Daughter     ALLERGIES:  is allergic to gabapentin , gluten meal, and lactose.  MEDICATIONS:  Current Outpatient Medications  Medication  Sig Dispense Refill   allopurinol  (ZYLOPRIM ) 100 MG tablet Take 100 mg by mouth 2 (two) times daily.      amitriptyline  (ELAVIL ) 25 MG tablet Take 25-50 mg by mouth at bedtime.      azelastine  (ASTELIN ) 0.1 % nasal spray Place 2 sprays into both nostrils 2 (two) times daily.      B Complex Vitamins (VITAMIN B COMPLEX) TABS Take 1 tablet by mouth daily.     buPROPion (WELLBUTRIN XL) 150 MG 24 hr tablet Take 150 mg by mouth 2 (two) times daily.     celecoxib (CELEBREX) 100 MG capsule Take 100 mg by mouth daily.     Cholecalciferol 25 MCG (1000 UT) capsule Take by mouth.     cyanocobalamin  1000 MCG tablet Take 1 tablet by mouth daily.     cyclobenzaprine (FLEXERIL) 10 MG tablet Take by mouth.     DULoxetine  (CYMBALTA ) 60 MG capsule Take 60 mg by mouth daily.     famotidine  (PEPCID ) 20 MG tablet Take 20 mg by mouth 2 (two) times daily.     furosemide (LASIX) 20 MG tablet Take 20 mg by mouth daily.     loratadine  (CLARITIN ) 10 MG tablet Take 10 mg by mouth daily.     losartan  (COZAAR ) 50 MG tablet Take 50 mg by mouth daily.      memantine (NAMENDA) 10 MG tablet Take by mouth.     ondansetron  (ZOFRAN -ODT) 4 MG disintegrating tablet Take 4 mg by mouth every 8 (eight) hours as needed.  pantoprazole  (PROTONIX ) 40 MG tablet Take 40 mg by mouth daily.     simethicone  (MYLICON) 80 MG chewable tablet Chew 2 tablets (160 mg total) by mouth 4 (four) times daily as needed for flatulence. 30 tablet 0   simvastatin  (ZOCOR ) 40 MG tablet Take 40 mg by mouth daily.     thiamine  100 MG tablet Take 1 tablet by mouth daily.     timolol  (TIMOPTIC ) 0.5 % ophthalmic solution Place 1 drop into the left eye 2 (two) times daily.      No current facility-administered medications for this visit.    Review of Systems  Constitutional:  Negative for appetite change, chills, fatigue, fever and unexpected weight change.  HENT:   Negative for hearing loss and voice change.   Eyes:  Negative for eye problems and icterus.   Respiratory:  Negative for chest tightness, cough and shortness of breath.   Cardiovascular:  Positive for leg swelling. Negative for chest pain.  Gastrointestinal:  Negative for abdominal distention and abdominal pain.  Endocrine: Negative for hot flashes.  Genitourinary:  Negative for difficulty urinating, dysuria and frequency.   Musculoskeletal:  Negative for arthralgias.  Skin:  Negative for itching and rash.  Neurological:  Negative for light-headedness and numbness.  Hematological:  Negative for adenopathy. Does not bruise/bleed easily.  Psychiatric/Behavioral:  Negative for confusion.    PHYSICAL EXAMINATION: ECOG PERFORMANCE STATUS: 1 - Symptomatic but completely ambulatory Vitals:   11/09/23 1311  BP: 110/66  Pulse: (!) 54  Resp: 17  Temp: 98.6 F (37 C)  SpO2: 99%   Filed Weights   11/09/23 1311  Weight: 242 lb 3.2 oz (109.9 kg)    Physical Exam Constitutional:      General: He is not in acute distress. HENT:     Head: Normocephalic and atraumatic.  Eyes:     General: No scleral icterus. Cardiovascular:     Rate and Rhythm: Normal rate and regular rhythm.     Heart sounds: Normal heart sounds.  Pulmonary:     Effort: Pulmonary effort is normal. No respiratory distress.     Breath sounds: No wheezing.  Abdominal:     General: Bowel sounds are normal. There is no distension.     Palpations: Abdomen is soft.  Musculoskeletal:        General: Deformity present. Normal range of motion.     Cervical back: Normal range of motion and neck supple.     Comments: Bilateral lower extremity erythema +1  Skin:    General: Skin is warm and dry.     Findings: No erythema or rash.  Neurological:     Mental Status: He is alert and oriented to person, place, and time. Mental status is at baseline.  Psychiatric:        Mood and Affect: Mood normal.     LABORATORY DATA:  I have reviewed the data as listed    Latest Ref Rng & Units 11/02/2023   10:01 AM 08/25/2023     9:43 AM 06/13/2017    5:16 AM  CBC  WBC 4.0 - 10.5 K/uL 6.2  5.5  6.0   Hemoglobin 13.0 - 17.0 g/dL 87.4  88.7  87.1   Hematocrit 39.0 - 52.0 % 39.7  37.2  37.5   Platelets 150 - 400 K/uL 173  189  166       Latest Ref Rng & Units 06/12/2017    6:59 AM 06/10/2017    4:58 AM 06/09/2017  4:44 PM  CMP  Glucose 65 - 99 mg/dL 897  878  822   BUN 6 - 20 mg/dL 14  14  13    Creatinine 0.61 - 1.24 mg/dL 8.99  9.05  9.19   Sodium 135 - 145 mmol/L 142  135  135   Potassium 3.5 - 5.1 mmol/L 4.0  3.7  4.5   Chloride 101 - 111 mmol/L 110  103  97   CO2 22 - 32 mmol/L 23  22    Calcium 8.9 - 10.3 mg/dL 9.0  9.0        RADIOGRAPHIC STUDIES: I have personally reviewed the radiological images as listed and agreed with the findings in the report. No results found.

## 2023-11-09 NOTE — Assessment & Plan Note (Addendum)
 Labs are reviewed and discussed with patient. Lab Results  Component Value Date   HGB 12.5 (L) 11/02/2023   TIBC 346 11/02/2023   IRONPCTSAT 11 (L) 11/02/2023   FERRITIN 19 (L) 11/02/2023    Some improvement of hemoglobin.  Still persistent iron  deficient.  Recommend IV Venofer  weekly x 2. Suspect GI blood loss.  He has discussed with gastroenterology.  Pending a discussion with anesthesiologist before making a decision for endoscopy.

## 2023-11-09 NOTE — Patient Instructions (Signed)

## 2023-11-09 NOTE — Progress Notes (Signed)
 Patient tolerated Venofer infusion well, no questions/concerns voiced. Monitored 30 min post transfusion. Patient stable at discharge. VSS. AVS given.

## 2023-11-23 ENCOUNTER — Inpatient Hospital Stay

## 2023-11-23 VITALS — BP 114/74 | HR 48 | Temp 96.8°F | Resp 18

## 2023-11-23 DIAGNOSIS — Z9884 Bariatric surgery status: Secondary | ICD-10-CM

## 2023-11-23 DIAGNOSIS — D509 Iron deficiency anemia, unspecified: Secondary | ICD-10-CM

## 2023-11-23 MED ORDER — IRON SUCROSE 20 MG/ML IV SOLN
200.0000 mg | Freq: Once | INTRAVENOUS | Status: AC
  Administered 2023-11-23: 200 mg via INTRAVENOUS
  Filled 2023-11-23: qty 10

## 2023-11-23 NOTE — Patient Instructions (Signed)

## 2024-02-24 ENCOUNTER — Telehealth: Payer: Self-pay | Admitting: Oncology

## 2024-02-24 NOTE — Telephone Encounter (Signed)
 Patients wife called to say he needed to cancel lab appointment on 1/5 due to him having labs at [facility] and that provider said he didn't need them here.  She asked if Dr. Babara would still see the patient and use those labs.   Appointment for lab cancelled as requested.   Please call and let them know if he needs to have labs here for visit.

## 2024-02-26 NOTE — Telephone Encounter (Signed)
 Per Dr. Babara, ok to keep MD/ venofer  on 1/12. No need to repeat labs. Sharon notified.

## 2024-02-29 ENCOUNTER — Other Ambulatory Visit

## 2024-03-07 ENCOUNTER — Inpatient Hospital Stay: Attending: Oncology | Admitting: Oncology

## 2024-03-07 ENCOUNTER — Inpatient Hospital Stay

## 2024-03-07 ENCOUNTER — Encounter: Payer: Self-pay | Admitting: Oncology

## 2024-03-07 VITALS — BP 114/73 | HR 72 | Temp 98.0°F | Resp 18

## 2024-03-07 VITALS — BP 129/81 | HR 59 | Temp 96.8°F | Resp 18 | Wt 247.0 lb

## 2024-03-07 DIAGNOSIS — K900 Celiac disease: Secondary | ICD-10-CM | POA: Diagnosis not present

## 2024-03-07 DIAGNOSIS — D509 Iron deficiency anemia, unspecified: Secondary | ICD-10-CM | POA: Insufficient documentation

## 2024-03-07 DIAGNOSIS — Z9884 Bariatric surgery status: Secondary | ICD-10-CM

## 2024-03-07 DIAGNOSIS — Z8042 Family history of malignant neoplasm of prostate: Secondary | ICD-10-CM | POA: Insufficient documentation

## 2024-03-07 DIAGNOSIS — Z8546 Personal history of malignant neoplasm of prostate: Secondary | ICD-10-CM | POA: Diagnosis not present

## 2024-03-07 DIAGNOSIS — Z808 Family history of malignant neoplasm of other organs or systems: Secondary | ICD-10-CM | POA: Insufficient documentation

## 2024-03-07 DIAGNOSIS — Z87891 Personal history of nicotine dependence: Secondary | ICD-10-CM | POA: Insufficient documentation

## 2024-03-07 MED ORDER — IRON SUCROSE 20 MG/ML IV SOLN
200.0000 mg | Freq: Once | INTRAVENOUS | Status: AC
  Administered 2024-03-07: 200 mg via INTRAVENOUS
  Filled 2024-03-07: qty 10

## 2024-03-07 NOTE — Assessment & Plan Note (Addendum)
 Labs are reviewed and discussed with patient. Patient elected to get the blood work done at her primary care doctor's office at the beginning of December. Persistent iron  deficient anemia. Recommend IV Venofer  every 2 weeks x 4.   Suspect GI blood loss.  He has discussed with gastroenterology.  Pending  endoscopy evaluation in spring 2026.[name]

## 2024-03-07 NOTE — Assessment & Plan Note (Signed)
 He is status post robotic assisted laparoscopic prostatectomy with bilateral lymph node dissection and without nerve spare technique 01/22/2016. Final surgical pathology demonstrated pT2,  N0,  Mx,  R0, Gleason 4+3=7 adenocarcinoma of the prostate  Previously followed by [facility].

## 2024-03-07 NOTE — Patient Instructions (Signed)

## 2024-03-07 NOTE — Progress Notes (Signed)
 " Hematology/Oncology Progress note [phone] [phone]         CHIEF COMPLAINTS/REASON FOR VISIT:  iron  deficiency anemia    ASSESSMENT & PLAN:   IDA (iron  deficiency anemia) Labs are reviewed and discussed with patient. Patient elected to get the blood work done at her primary care doctor's office at the beginning of December. Persistent iron  deficient anemia. Recommend IV Venofer  every 2 weeks x 4.   Suspect GI blood loss.  He has discussed with gastroenterology.  Pending  endoscopy evaluation in spring 2026.[name]  History of prostate cancer He is status post robotic assisted laparoscopic prostatectomy with bilateral lymph node dissection and without nerve spare technique 01/22/2016. Final surgical pathology demonstrated pT2,  N0,  Mx,  R0, Gleason 4+3=7 adenocarcinoma of the prostate  Previously followed by [facility].    Orders Placed This Encounter  Procedures   CBC (Cancer Center Only)    Standing Status:   Future    Expected Date:   07/05/2024    Expiration Date:   10/03/2024   Iron  and TIBC    Standing Status:   Future    Expected Date:   07/05/2024    Expiration Date:   10/03/2024   Ferritin    Standing Status:   Future    Expected Date:   07/05/2024    Expiration Date:   10/03/2024   Retic Panel    Standing Status:   Future    Expected Date:   07/05/2024    Expiration Date:   10/03/2024    Follow-up in 4 months. We spent sufficient time to discuss many aspect of care, questions were answered to patient's satisfaction.  Zelphia Cap, MD, [name] [facility] Hematology Oncology 03/07/2024   HISTORY OF PRESENTING ILLNESS:   [name] is a  74 y.o.  male with PMH listed below was seen in consultation at the request of  Cap Zelphia, MD  for evaluation of iron  deficiency anemia.  He reports a history of iron  deficiency and has previously received iron  infusions a few years ago at [facility] though he does not recall the specifics. No issues followed past  infusions. Recent blood work on 05/14/2023 confirmed iron  deficiency with hemoglobin 10.1, MCV 74.7, ferritin 3.5.  Iron  saturation 5%.   Patient denies any rectal bleeding, black stool.  Not on anticoagulation or antiplatelet medication. He has history of celiac disease as well as history of gastric bypass  Last colonoscopy was about 6 years ago.  He experiences fatigue and dyspnea with minimal exertion, describing it as 'gasping for breath' and having 'no stamina.' No pica, lightheadedness, or chest pain.  He has a history of a partial toe amputation performed without anesthesia due to concerns about tolerating anesthesia.  Patient has a history of prostate cancer.  INTERVAL HISTORY [name] is a 74 y.o. male who has above history reviewed by me today presents for follow up visit for iron  deficiency anemia.  Tolerated IV Venofer  treatments.    Patient recently had toe amputation.+ Fatigue.  He follows up with gastroenterology and there is plan for endoscopy in spring 2026.   MEDICAL HISTORY:  Past Medical History:  Diagnosis Date   Anemia, unspecified    BPH (benign prostatic hyperplasia)    Bradycardia    Celiac disease    Chronic sinusitis    DVT (deep venous thrombosis) (HCC)    after gastric bypass surgery   Enlarged prostate    GERD (gastroesophageal reflux disease)  Glaucoma    Gout    Hernia of abdominal cavity    Hypercholesteremia    Hyperlipemia    Hypertension    Lactose intolerance    Major depressive disorder    Malignant neoplasm of prostate (HCC)    Neuropathy    OSA (obstructive sleep apnea)    Osteoarthritis of right knee    Peptic ulcer    Polyneuropathy    Postgastric surgery syndromes    Tachycardia     SURGICAL HISTORY: Past Surgical History:  Procedure Laterality Date   accesory pathway ablation     GASTRIC BYPASS  2008   due to ulcer   HERNIA REPAIR     ventral   INGUINAL HERNIA REPAIR Left    MASTECTOMY Right    SHOULDER SURGERY  Right    replacement   SMALL BOWEL REPAIR  2012   for ulcer     SOCIAL HISTORY: Social History   Socioeconomic History   Marital status: Married    Spouse name: Not on file   Number of children: 1   Years of education: Masters   Highest education level: Not on file  Occupational History   Occupation: retired  Tobacco Use   Smoking status: Former    Current packs/day: 0.00    Types: Cigarettes    Quit date: 02/25/1980    Years since quitting: 44.0   Smokeless tobacco: Never  Substance and Sexual Activity   Alcohol use: No    Alcohol/week: 0.0 standard drinks of alcohol   Drug use: No   Sexual activity: Yes  Other Topics Concern   Not on file  Social History Narrative   No living will   Wife, then daughter should be health care POA   Would accept resuscitation   Not sure about tube feeds   Social Drivers of Health   Tobacco Use: Medium Risk (03/07/2024)   Patient History    Smoking Tobacco Use: Former    Smokeless Tobacco Use: Never    Passive Exposure: Not on Actuary Strain: Low Risk (07/29/2023)   Received from [facility]   Overall Financial Resource Strain (CARDIA)    Difficulty of Paying Living Expenses: Not hard at all  Food Insecurity: No Food Insecurity (07/29/2023)   Received from [facility]   Epic    Within the past 12 months, you worried that your food would run out before you got the money to buy more.: Never true    Within the past 12 months, the food you bought just didn't last and you didn't have money to get more.: Never true  Transportation Needs: No Transportation Needs (07/29/2023)   Received from [facility]   PRAPARE - Transportation    Lack of Transportation (Medical): No    Lack of Transportation (Non-Medical): No  Physical Activity: Inactive (07/29/2023)   Received from [facility]   Exercise Vital Sign    On average, how many days per week do you engage in moderate to strenuous exercise (like a brisk walk)?: 0  days    On average, how many minutes do you engage in exercise at this level?: 0 min  Stress: No Stress Concern Present (07/29/2023)   Received from [facility] - Occupational Stress Questionnaire    Feeling of Stress : Not at all  Social Connections: Socially Integrated (07/29/2023)   Received from [facility]   Social Connection and Isolation  Panel    In a typical week, how many times do you talk on the phone with family, friends, or neighbors?: More than three times a week    How often do you get together with friends or relatives?: More than three times a week    How often do you attend church or religious services?: More than 4 times per year    Do you belong to any clubs or organizations such as church groups, unions, fraternal or athletic groups, or school groups?: Yes    How often do you attend meetings of the clubs or organizations you belong to?: More than 4 times per year    Are you married, widowed, divorced, separated, never married, or living with a partner?: Married  Intimate Partner Violence: Not At Risk (07/29/2023)   Received from [facility]   Epic    Within the last year, have you been afraid of your partner or ex-partner?: No    Within the last year, have you been humiliated or emotionally abused in other ways by your partner or ex-partner?: No    Within the last year, have you been kicked, hit, slapped, or otherwise physically hurt by your partner or ex-partner?: No    Within the last year, have you been raped or forced to have any kind of sexual activity by your partner or ex-partner?: No  Depression (PHQ2-9): Low Risk (10/05/2023)   Depression (PHQ2-9)    PHQ-2 Score: 0  Alcohol Screen: Not on file  Housing: Unknown (10/16/2023)   Received from [facility]   Epic    Unable to Pay for Housing in the Last Year: Not on file    Number of Times Moved in the Last Year: Not on file    At any time in the  past 12 months, were you homeless or living in a shelter (including now)?: No  Utilities: Low Risk (07/29/2023)   Received from [facility]   Utilities    Within the past 12 months, have you been unable to get utilities(heat, electricity) when it was really needed?: No  Health Literacy: Low Risk (07/29/2023)   Received from [facility] Literacy    How often do you need to have someone help you when you read instructions, pamphlets, or other written material from your doctor or pharmacy?: Never    FAMILY HISTORY: Family History  Problem Relation Age of Onset   Alzheimer's disease Mother    Dementia Mother    Pulmonary disease Mother        aspiration pneumonia   Heart disease Father    Arrhythmia Father        pacemaker   Prostate cancer Father    Healthy Sister    Healthy Brother    Throat cancer Brother    Healthy Daughter     ALLERGIES:  is allergic to gabapentin , gluten meal, and lactose.  MEDICATIONS:  Current Outpatient Medications  Medication Sig Dispense Refill   allopurinol  (ZYLOPRIM ) 100 MG tablet Take 100 mg by mouth 2 (two) times daily.      amitriptyline  (ELAVIL ) 25 MG tablet Take 25-50 mg by mouth at bedtime.      B Complex Vitamins (VITAMIN B COMPLEX) TABS Take 1 tablet by mouth daily.     buPROPion (WELLBUTRIN XL) 150 MG 24 hr tablet Take 150 mg by mouth 2 (two) times daily.     celecoxib (CELEBREX) 100 MG capsule Take 100 mg by mouth  daily.     Cholecalciferol 25 MCG (1000 UT) capsule Take by mouth.     cyanocobalamin  1000 MCG tablet Take 1 tablet by mouth daily.     doxycycline (VIBRA-TABS) 100 MG tablet Take 100 mg by mouth.     DULoxetine  (CYMBALTA ) 60 MG capsule Take 60 mg by mouth daily.     furosemide (LASIX) 20 MG tablet Take 20 mg by mouth daily.     memantine (NAMENDA) 10 MG tablet Take by mouth.     ondansetron  (ZOFRAN -ODT) 4 MG disintegrating tablet Take 4 mg by mouth every 8 (eight) hours as needed.     pantoprazole  (PROTONIX ) 40  MG tablet Take 40 mg by mouth daily.     thiamine  100 MG tablet Take 1 tablet by mouth daily.     timolol  (TIMOPTIC ) 0.5 % ophthalmic solution Place 1 drop into the left eye 2 (two) times daily.      azelastine  (ASTELIN ) 0.1 % nasal spray Place 2 sprays into both nostrils 2 (two) times daily.  (Patient not taking: Reported on 03/07/2024)     cyclobenzaprine (FLEXERIL) 10 MG tablet Take by mouth. (Patient not taking: Reported on 03/07/2024)     famotidine  (PEPCID ) 20 MG tablet Take 20 mg by mouth 2 (two) times daily. (Patient not taking: Reported on 03/07/2024)     loratadine  (CLARITIN ) 10 MG tablet Take 10 mg by mouth daily. (Patient not taking: Reported on 03/07/2024)     losartan  (COZAAR ) 50 MG tablet Take 50 mg by mouth daily.  (Patient not taking: Reported on 03/07/2024)     simethicone  (MYLICON) 80 MG chewable tablet Chew 2 tablets (160 mg total) by mouth 4 (four) times daily as needed for flatulence. (Patient not taking: Reported on 03/07/2024) 30 tablet 0   simvastatin  (ZOCOR ) 40 MG tablet Take 40 mg by mouth daily. (Patient not taking: Reported on 03/07/2024)     No current facility-administered medications for this visit.    Review of Systems  Constitutional:  Positive for fatigue. Negative for appetite change, chills, fever and unexpected weight change.  HENT:   Negative for hearing loss and voice change.   Eyes:  Negative for eye problems and icterus.  Respiratory:  Negative for chest tightness, cough and shortness of breath.   Cardiovascular:  Positive for leg swelling. Negative for chest pain.  Gastrointestinal:  Negative for abdominal distention and abdominal pain.  Endocrine: Negative for hot flashes.  Genitourinary:  Negative for difficulty urinating, dysuria and frequency.   Musculoskeletal:  Negative for arthralgias.       Toe amputation  Skin:  Negative for itching and rash.  Neurological:  Negative for light-headedness and numbness.  Hematological:  Negative for adenopathy.  Does not bruise/bleed easily.  Psychiatric/Behavioral:  Negative for confusion.    PHYSICAL EXAMINATION: ECOG PERFORMANCE STATUS: 1 - Symptomatic but completely ambulatory Vitals:   03/07/24 1345  BP: 129/81  Pulse: (!) 59  Resp: 18  Temp: (!) 96.8 F (36 C)  SpO2: 99%   Filed Weights   03/07/24 1345  Weight: 247 lb (112 kg)    Physical Exam Constitutional:      General: He is not in acute distress. HENT:     Head: Normocephalic and atraumatic.  Eyes:     General: No scleral icterus. Cardiovascular:     Rate and Rhythm: Normal rate and regular rhythm.     Heart sounds: Normal heart sounds.  Pulmonary:     Effort: Pulmonary effort is normal. No respiratory  distress.     Breath sounds: No wheezing.  Abdominal:     General: Bowel sounds are normal. There is no distension.     Palpations: Abdomen is soft.  Musculoskeletal:        General: Deformity present. Normal range of motion.     Cervical back: Normal range of motion and neck supple.     Comments: Bilateral lower extremity erythema +1  Skin:    General: Skin is warm and dry.     Findings: No erythema or rash.  Neurological:     Mental Status: He is alert and oriented to person, place, and time. Mental status is at baseline.  Psychiatric:        Mood and Affect: Mood normal.     LABORATORY DATA:  I have reviewed the data as listed    Latest Ref Rng & Units 11/02/2023   10:01 AM 08/25/2023    9:43 AM 06/13/2017    5:16 AM  CBC  WBC 4.0 - 10.5 K/uL 6.2  5.5  6.0   Hemoglobin 13.0 - 17.0 g/dL 87.4  88.7  87.1   Hematocrit 39.0 - 52.0 % 39.7  37.2  37.5   Platelets 150 - 400 K/uL 173  189  166       Latest Ref Rng & Units 06/12/2017    6:59 AM 06/10/2017    4:58 AM 06/09/2017    4:44 PM  CMP  Glucose 65 - 99 mg/dL 897  878  822   BUN 6 - 20 mg/dL 14  14  13    Creatinine 0.61 - 1.24 mg/dL 8.99  9.05  9.19   Sodium 135 - 145 mmol/L 142  135  135   Potassium 3.5 - 5.1 mmol/L 4.0  3.7  4.5   Chloride 101 - 111  mmol/L 110  103  97   CO2 22 - 32 mmol/L 23  22    Calcium 8.9 - 10.3 mg/dL 9.0  9.0        RADIOGRAPHIC STUDIES: I have personally reviewed the radiological images as listed and agreed with the findings in the report. No results found.       "

## 2024-03-22 ENCOUNTER — Inpatient Hospital Stay

## 2024-03-22 VITALS — BP 129/77 | HR 76 | Temp 97.3°F | Resp 18

## 2024-03-22 DIAGNOSIS — Z9884 Bariatric surgery status: Secondary | ICD-10-CM

## 2024-03-22 DIAGNOSIS — D509 Iron deficiency anemia, unspecified: Secondary | ICD-10-CM | POA: Diagnosis not present

## 2024-03-22 MED ORDER — IRON SUCROSE 20 MG/ML IV SOLN
200.0000 mg | Freq: Once | INTRAVENOUS | Status: AC
  Administered 2024-03-22: 200 mg via INTRAVENOUS

## 2024-03-22 NOTE — Patient Instructions (Signed)

## 2024-04-18 ENCOUNTER — Inpatient Hospital Stay

## 2024-05-03 ENCOUNTER — Inpatient Hospital Stay

## 2024-06-27 ENCOUNTER — Inpatient Hospital Stay

## 2024-07-05 ENCOUNTER — Inpatient Hospital Stay

## 2024-07-05 ENCOUNTER — Inpatient Hospital Stay: Admitting: Oncology
